# Patient Record
Sex: Female | Born: 1960 | Race: Black or African American | Hispanic: No | Marital: Married | Smoking: Never smoker
Health system: Southern US, Community
[De-identification: ages and names within clinical notes are randomized; demographics above are authoritative.]

## PROBLEM LIST (undated history)

## (undated) HISTORY — PX: NO PAST SURGERIES: SHX2092

---

## 2006-01-17 ENCOUNTER — Ambulatory Visit: Payer: Self-pay

## 2007-09-09 ENCOUNTER — Ambulatory Visit: Payer: Self-pay

## 2009-04-20 ENCOUNTER — Ambulatory Visit: Payer: Self-pay | Admitting: Obstetrics and Gynecology

## 2009-04-29 ENCOUNTER — Ambulatory Visit: Payer: Self-pay | Admitting: Obstetrics and Gynecology

## 2009-06-15 ENCOUNTER — Ambulatory Visit: Payer: Self-pay | Admitting: Obstetrics and Gynecology

## 2010-03-31 ENCOUNTER — Ambulatory Visit: Payer: Self-pay | Admitting: Gastroenterology

## 2010-04-04 LAB — PATHOLOGY REPORT

## 2010-08-26 ENCOUNTER — Ambulatory Visit: Payer: Self-pay | Admitting: Family Medicine

## 2010-09-12 ENCOUNTER — Ambulatory Visit: Payer: Self-pay | Admitting: Internal Medicine

## 2011-01-09 ENCOUNTER — Ambulatory Visit: Payer: Self-pay | Admitting: Family Medicine

## 2011-03-02 ENCOUNTER — Emergency Department: Payer: Self-pay | Admitting: Emergency Medicine

## 2011-03-02 ENCOUNTER — Ambulatory Visit: Payer: Self-pay | Admitting: Internal Medicine

## 2012-09-24 ENCOUNTER — Ambulatory Visit: Payer: Self-pay | Admitting: Family Medicine

## 2014-05-04 ENCOUNTER — Ambulatory Visit: Payer: Self-pay | Admitting: Family Medicine

## 2014-05-23 ENCOUNTER — Ambulatory Visit: Payer: Self-pay | Admitting: Emergency Medicine

## 2015-01-19 ENCOUNTER — Other Ambulatory Visit: Payer: Self-pay | Admitting: Family Medicine

## 2015-01-19 DIAGNOSIS — Z1231 Encounter for screening mammogram for malignant neoplasm of breast: Secondary | ICD-10-CM

## 2015-11-22 ENCOUNTER — Other Ambulatory Visit: Payer: Self-pay | Admitting: Family Medicine

## 2015-11-22 DIAGNOSIS — D649 Anemia, unspecified: Secondary | ICD-10-CM | POA: Insufficient documentation

## 2015-11-22 DIAGNOSIS — Z1231 Encounter for screening mammogram for malignant neoplasm of breast: Secondary | ICD-10-CM

## 2015-11-22 DIAGNOSIS — D259 Leiomyoma of uterus, unspecified: Secondary | ICD-10-CM | POA: Insufficient documentation

## 2015-11-22 DIAGNOSIS — D126 Benign neoplasm of colon, unspecified: Secondary | ICD-10-CM | POA: Insufficient documentation

## 2015-11-22 DIAGNOSIS — B977 Papillomavirus as the cause of diseases classified elsewhere: Secondary | ICD-10-CM | POA: Insufficient documentation

## 2015-12-19 ENCOUNTER — Ambulatory Visit
Admission: RE | Admit: 2015-12-19 | Discharge: 2015-12-19 | Disposition: A | Discharge status: Home / Self Care | Payer: BLUE CROSS/BLUE SHIELD | Source: Ambulatory Visit | Attending: Family Medicine | Admitting: Family Medicine

## 2015-12-19 ENCOUNTER — Other Ambulatory Visit: Payer: Self-pay | Admitting: Family Medicine

## 2015-12-19 DIAGNOSIS — Z1231 Encounter for screening mammogram for malignant neoplasm of breast: Secondary | ICD-10-CM

## 2016-05-25 ENCOUNTER — Other Ambulatory Visit: Payer: Self-pay | Admitting: Obstetrics and Gynecology

## 2016-05-25 DIAGNOSIS — Z1231 Encounter for screening mammogram for malignant neoplasm of breast: Secondary | ICD-10-CM

## 2016-12-11 DIAGNOSIS — E1169 Type 2 diabetes mellitus with other specified complication: Secondary | ICD-10-CM | POA: Insufficient documentation

## 2016-12-24 ENCOUNTER — Ambulatory Visit: Payer: BLUE CROSS/BLUE SHIELD | Attending: Obstetrics and Gynecology

## 2017-04-01 ENCOUNTER — Ambulatory Visit
Admission: RE | Admit: 2017-04-01 | Discharge: 2017-04-01 | Disposition: A | Discharge status: Home / Self Care | Payer: BLUE CROSS/BLUE SHIELD | Source: Ambulatory Visit | Attending: Obstetrics and Gynecology | Admitting: Obstetrics and Gynecology

## 2017-04-01 ENCOUNTER — Other Ambulatory Visit: Payer: Self-pay | Admitting: Obstetrics and Gynecology

## 2017-04-01 DIAGNOSIS — Z1231 Encounter for screening mammogram for malignant neoplasm of breast: Secondary | ICD-10-CM

## 2017-05-27 DIAGNOSIS — E538 Deficiency of other specified B group vitamins: Secondary | ICD-10-CM | POA: Insufficient documentation

## 2018-03-19 DIAGNOSIS — E7800 Pure hypercholesterolemia, unspecified: Secondary | ICD-10-CM | POA: Insufficient documentation

## 2018-03-19 DIAGNOSIS — Z79899 Other long term (current) drug therapy: Secondary | ICD-10-CM | POA: Insufficient documentation

## 2018-04-11 DIAGNOSIS — R200 Anesthesia of skin: Secondary | ICD-10-CM | POA: Insufficient documentation

## 2018-04-11 DIAGNOSIS — R202 Paresthesia of skin: Secondary | ICD-10-CM | POA: Insufficient documentation

## 2018-05-08 ENCOUNTER — Other Ambulatory Visit: Payer: Self-pay | Admitting: Internal Medicine

## 2018-05-08 DIAGNOSIS — Z1231 Encounter for screening mammogram for malignant neoplasm of breast: Secondary | ICD-10-CM

## 2018-05-21 ENCOUNTER — Encounter (INDEPENDENT_AMBULATORY_CARE_PROVIDER_SITE_OTHER): Payer: Self-pay

## 2018-05-21 ENCOUNTER — Ambulatory Visit
Admission: RE | Admit: 2018-05-21 | Discharge: 2018-05-21 | Disposition: A | Discharge status: Home / Self Care | Payer: BLUE CROSS/BLUE SHIELD | Source: Ambulatory Visit | Attending: Internal Medicine | Admitting: Internal Medicine

## 2018-05-21 DIAGNOSIS — Z1231 Encounter for screening mammogram for malignant neoplasm of breast: Secondary | ICD-10-CM

## 2018-12-04 ENCOUNTER — Other Ambulatory Visit: Payer: Self-pay

## 2018-12-04 ENCOUNTER — Ambulatory Visit
Admission: EM | Admit: 2018-12-04 | Discharge: 2018-12-04 | Disposition: A | Discharge status: Home / Self Care | Payer: BC Managed Care – PPO | Attending: Family Medicine | Admitting: Family Medicine

## 2018-12-04 DIAGNOSIS — H532 Diplopia: Secondary | ICD-10-CM | POA: Diagnosis not present

## 2018-12-04 NOTE — Discharge Instructions (Signed)
Call Dr. Ellin Mayhew tomorrow for an appt tomorrow.  If it occurs again, go to the ER.  Take care  Dr. Lacinda Axon

## 2018-12-04 NOTE — ED Provider Notes (Signed)
[facility]    CSN: [id number] Arrival date & time: 12/04/18  1821  History   Chief Complaint Chief Complaint  Patient presents with  . Diplopia   HPI  58 year old female presents with double vision.  Patient reports that she has had 2 episodes of double vision. First was 2 weeks ago.  Lasted approximately 60 minutes and resolved.  She states that she had an additional episode today.  Lasted approximately 1 minute and then resolved.  Patient states she did have her glasses on.  She does not have them with her currently.  She currently has no double vision.  No reports of speech difficulty, numbness, tingling, or weakness.  No reports of chest pain or shortness of breath.  No reports of vision loss.  No known inciting factor.  No known exacerbating relieving factors.  No other complaints.  PMH, Surgical Hx, Family Hx, Social History reviewed and updated as below.  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   Tubular adenoma of colon, unspecified 2011   Anemia    Allergic state    Arthritis    Type 2 diabetes mellitus with other specified complication, without long-term current use of insulin (CMS-HCC) 12/11/2016   Hypertension associated with type 2 diabetes mellitus (CMS-HCC)    Hypercholesterolemia 03/19/2018    Surgical Hx: CESAREAN SECTION      TUBAL LIGATION      COLONOSCOPY 03/31/2010  Adenomatous Polyp, FH Colon Polyps (Father): CBF 03/2015   COLONOSCOPY 09/20/2016  PH Adenomatous Polyp, FH Colon Polyps (Father): CBF 08/2021   COLPOSCOPY 11/11/2018      OB History   No obstetric history on file.    Home Medications    Prior to Admission medications   Medication Sig Start Date End Date Taking? Authorizing Provider  lisinopril-hydrochlorothiazide (ZESTORETIC) 10-12.5 MG tablet TAKE 1 TABLET BY MOUTH IN THE MORNING FOR BLOOD PRESSURE 10/13/18  Yes [provider]  loratadine (CLARITIN) 10 MG tablet  TAKE 1 TABLET BY MOUTH ONCE DAILY FOR ALLERGIES 06/18/18  Yes [provider]  metFORMIN (GLUCOPHAGE) 500 MG tablet TAKE ONE HALF TO ONE TABLET BY MOUTH ONCE DAILY AS DIRECTED. TAKE WITH FOOD FOR DIABETES. 06/17/18  Yes [provider]   Family History Family History  Problem Relation Age of Onset  . Diabetes Mother   . Hypertension Mother   . Dementia Father   . Breast cancer Neg Hx    Social History Social History   Tobacco Use  . Smoking status: Never Smoker  . Smokeless tobacco: Never Used  Substance Use Topics  . Alcohol use: Yes    Frequency: Never    Comment: rare  . Drug use: Never   Allergies   Tramadol   Review of Systems Review of Systems  Constitutional: Negative.   Eyes:       Double vision.  Neurological: Negative.    Physical Exam Triage Vital Signs ED Triage Vitals  Enc Vitals Group     BP 12/04/18 1853 (!) 149/78     Pulse Rate 12/04/18 1853 72     Resp 12/04/18 1853 16     Temp 12/04/18 1853 98.4 F (36.9 C)     Temp Source 12/04/18 1853 Oral     SpO2 12/04/18 1853 100 %     Weight 12/04/18 1850 162 lb (73.5 kg)     Height 12/04/18 1850 5' 3.5" (1.613 m)     Head Circumference --  Peak Flow --      Pain Score 12/04/18 1850 0     Pain Loc --      Pain Edu? --      Excl. in [place]? --    Updated Vital Signs BP (!) 149/78 (BP Location: Left Arm)   Pulse 72   Temp 98.4 F (36.9 C) (Oral)   Resp 16   Ht 5' 3.5" (1.613 m)   Wt 73.5 kg   SpO2 100%   BMI 28.25 kg/m   Visual Acuity Right Eye Distance: 20/50 Left Eye Distance: 20/30 Bilateral Distance: 20/25(uncorrected)  Right Eye Near:   Left Eye Near:    Bilateral Near:     Physical Exam Vitals signs and nursing note reviewed.  Constitutional:      General: She is not in acute distress.    Appearance: Normal appearance. She is not ill-appearing.  HENT:     Head: Normocephalic and atraumatic.  Eyes:     General: No scleral icterus.       Right eye: No  discharge.        Left eye: No discharge.     Extraocular Movements: Extraocular movements intact.     Conjunctiva/sclera: Conjunctivae normal.     Pupils: Pupils are equal, round, and reactive to light.  Cardiovascular:     Rate and Rhythm: Normal rate and regular rhythm.     Heart sounds: No murmur.  Pulmonary:     Effort: Pulmonary effort is normal.     Breath sounds: Normal breath sounds. No wheezing, rhonchi or rales.  Neurological:     General: No focal deficit present.     Mental Status: She is alert and oriented to person, place, and time.     Cranial Nerves: No cranial nerve deficit.     Sensory: No sensory deficit.     Motor: No weakness.  Psychiatric:        Mood and Affect: Mood normal.        Behavior: Behavior normal.    UC Treatments / Results  Labs (all labs ordered are listed, but only abnormal results are displayed) Labs Reviewed - No data to display  EKG   Radiology No results found.  Procedures Procedures (including critical care time)  Medications Ordered in UC Medications - No data to display  Initial Impression / Assessment and Plan / UC Course  I have reviewed the triage vital signs and the nursing notes.  Pertinent labs & imaging results that were available during my care of the patient were reviewed by me and considered in my medical decision making (see chart for details).    58 year old female presents with double vision.  She is currently asymptomatic.  Her exam is normal.  Uncertain etiology at this time.  Advised to contact her eye doctor and be seen tomorrow.  If symptoms recur, she should go to the ER for further evaluation and management.  Final Clinical Impressions(s) / UC Diagnoses   Final diagnoses:  Double vision     Discharge Instructions     Call Dr. Ellin Mayhew tomorrow for an appt tomorrow.  If it occurs again, go to the ER.  Take care  Dr. Lacinda Axon   ED Prescriptions    None     Controlled Substance Prescriptions  [place] Controlled Substance Registry consulted? Not Applicable   Coral Spikes, DO 12/04/18 2011

## 2018-12-04 NOTE — ED Triage Notes (Signed)
Patient states that she has had doubled vision episodes twice now. 1st one occurred 2 weeks ago and last about 30 seconds. 2nd episode occurred today and lasted around 2 mins. Patient states that she normally wears glasses but does not have them right now.

## 2019-09-15 ENCOUNTER — Other Ambulatory Visit: Payer: Self-pay | Admitting: Gerontology

## 2019-09-15 DIAGNOSIS — Z1231 Encounter for screening mammogram for malignant neoplasm of breast: Secondary | ICD-10-CM

## 2019-09-22 ENCOUNTER — Other Ambulatory Visit: Payer: Self-pay

## 2019-09-22 ENCOUNTER — Ambulatory Visit
Admission: RE | Admit: 2019-09-22 | Discharge: 2019-09-22 | Disposition: A | Discharge status: Home / Self Care | Payer: BC Managed Care – PPO | Source: Ambulatory Visit | Attending: Gerontology | Admitting: Gerontology

## 2019-09-22 DIAGNOSIS — Z1231 Encounter for screening mammogram for malignant neoplasm of breast: Secondary | ICD-10-CM

## 2019-09-24 DIAGNOSIS — I83893 Varicose veins of bilateral lower extremities with other complications: Secondary | ICD-10-CM | POA: Insufficient documentation

## 2019-09-24 DIAGNOSIS — G8929 Other chronic pain: Secondary | ICD-10-CM | POA: Insufficient documentation

## 2020-02-16 ENCOUNTER — Other Ambulatory Visit: Payer: Self-pay | Admitting: Obstetrics and Gynecology

## 2020-02-22 NOTE — H&P (Signed)
Brandy Hayden is a 59 y.o. female here for [facility].  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  Specimen B-Cervical Biopsy, cervix biopsy @ 12 o'clock:  BENIGN ENDOCERVICAL TISSUE.  Specimen C-Cervical Biopsy, cervix biopsy @ 5 o'clock:  HIGH-GRADE SQUAMOUS INTRAEPITHELIAL LESION (CIN 2).  TRANSFORMATION ZONE IDENTIFIED.  Specimen D-Cervical Biopsy, cervix biopsy @ 8 o'clock:  HIGH-GRADE SQUAMOUS INTRAEPITHELIAL LESION (CIN 2).    Past Medical History: has a past medical history of Allergic state, Anemia, Arthritis, Fibroid uterus, HPV test positive (10/23/11), Hypercholesterolemia (03/19/2018), Hypertension associated with type 2 diabetes mellitus (CMS-HCC), Panic attack, Tubular adenoma of colon, unspecified (2011), and Type 2 diabetes mellitus with other specified complication, without long-term current use of insulin (CMS-HCC) (12/11/2016).  Past Surgical History: has a past surgical history that includes Cesarean section; Tubal ligation; Colonoscopy (03/31/2010); Colonoscopy (09/20/2016); Colposcopy (11/11/2018); and ligation and division and complete stripping of long or short saphenous vein with excision deep fascia. Family History: family history includes Alzheimer's disease in her father; Atrial fibrillation (Abnormal heart rhythm sometimes requiring treatment with blood thinners) in her sister; Colon polyps in her father; Coronary Artery Disease (Blocked arteries around heart) in her mother; Diabetes type II in her brother and mother; Heart failure in her brother and sister; High blood pressure (Hypertension) in her father and mother; Myocardial Infarction (Heart attack) in her brother; Stroke in her mother. Social History: reports that she quit smoking about 36 years ago. She has never used smokeless tobacco.  She reports current alcohol use. She reports that she does not use drugs. OB/GYN History:  OB History  Gravida  2  Para  1  Term   Preterm   AB  1  Living  1   SAB  1  TAB   Ectopic   Molar   Multiple   Live Births  1      Allergies: is allergic to tramadol. Medications:  Current Outpatient Medications:  . cholecalciferol (VITAMIN D3) 1,000 unit capsule, Take 1,000 Units by mouth once daily, Disp: , Rfl:  . cyanocobalamin, vitamin B-12, (VITAMIN B-12) 5,000 mcg Subl, Place 1 tablet under the tongue once daily, Disp: , Rfl:  . lisinopriL-hydrochlorothiazide (ZESTORETIC) 10-12.5 mg tablet, Take 0.5 tablets by mouth every morning For blood pressure, Disp: 90 tablet, Rfl: 1 . loratadine (ALLERGY RELIEF, LORATADINE,) 10 mg tablet, Take 1 tablet (10 mg total) by mouth once daily For allergies, Disp: 90 tablet, Rfl: 3 . lovastatin (MEVACOR) 10 MG tablet, Take 1 tablet (10 mg total) by mouth every Sunday, Disp: 12 tablet, Rfl: 3 . metFORMIN (GLUCOPHAGE) 500 MG tablet, Take 1 tablet (500 mg total) by mouth daily with breakfast, Disp: 90 tablet, Rfl: 3 . potassium 99 mg Tab, Take 2 tablets by mouth once daily, Disp: , Rfl:   Review of Systems: General: No fatigue or weight loss Eyes: No vision changes Ears: No hearing difficulty Respiratory: No cough or shortness of breath Pulmonary: No asthma or shortness of breath Cardiovascular: No chest pain, palpitations, dyspnea on exertion Gastrointestinal: No abdominal bloating, chronic diarrhea, constipations, masses, pain or hematochezia Genitourinary: No hematuria, dysuria, abnormal vaginal discharge, pelvic pain, Menometrorrhagia Lymphatic: No swollen lymph nodes Musculoskeletal: No muscle weakness Neurologic: No extremity weakness, syncope, seizure disorder Psychiatric: No history of depression, delusions or suicidal/homicidal ideation  Exam:   Vitals:  12/14/19 1526  BP: 119/78  Pulse: 81   Body mass index is 27.72  kg/m.  WDWN black female in NAD  Lungs: CTA  CV : RRR without murmur   Neck: no thyromegaly Abdomen: soft , no mass, normal active bowel sounds, non-tender, no rebound tenderness Pelvic: tanner stage 5 ,  External genitalia: vulva /labia no lesions Urethra: no prolapse Vagina: normal physiologic d/c, adequate room for TVh if need be  Cervix: no lesions, no cervical motion tenderness  Uterus: normal size shape and contour, non-tender Adnexa: no mass, non-tender  Rectovaginal:   Impression:   Moderate cervical dysplasia    Plan:  After discussion and options for tx she has elected for a TVH and BSO Risks of the procedure discussed . SEE [place] notes .  Caroline Sauger, MD

## 2020-02-29 ENCOUNTER — Inpatient Hospital Stay: Admission: RE | Admit: 2020-02-29 | Payer: BC Managed Care – PPO | Source: Ambulatory Visit

## 2020-03-03 ENCOUNTER — Other Ambulatory Visit: Payer: BC Managed Care – PPO

## 2020-03-07 ENCOUNTER — Ambulatory Visit
Admission: RE | Admit: 2020-03-07 | Payer: BC Managed Care – PPO | Source: Home / Self Care | Admitting: Obstetrics and Gynecology

## 2020-03-07 ENCOUNTER — Encounter: Admission: RE | Payer: Self-pay | Source: Home / Self Care

## 2020-03-07 SURGERY — HYSTERECTOMY, VAGINAL
Anesthesia: General

## 2020-09-12 ENCOUNTER — Other Ambulatory Visit: Payer: Self-pay | Admitting: Gerontology

## 2020-09-12 DIAGNOSIS — Z1231 Encounter for screening mammogram for malignant neoplasm of breast: Secondary | ICD-10-CM

## 2020-09-22 ENCOUNTER — Other Ambulatory Visit: Payer: Self-pay

## 2020-09-22 ENCOUNTER — Ambulatory Visit
Admission: RE | Admit: 2020-09-22 | Discharge: 2020-09-22 | Disposition: A | Discharge status: Home / Self Care | Payer: BC Managed Care – PPO | Source: Ambulatory Visit | Attending: Gerontology | Admitting: Gerontology

## 2020-09-22 DIAGNOSIS — Z1231 Encounter for screening mammogram for malignant neoplasm of breast: Secondary | ICD-10-CM | POA: Insufficient documentation

## 2020-09-27 DIAGNOSIS — L608 Other nail disorders: Secondary | ICD-10-CM | POA: Insufficient documentation

## 2020-10-06 ENCOUNTER — Other Ambulatory Visit: Payer: Self-pay | Admitting: Gerontology

## 2020-10-06 DIAGNOSIS — N6489 Other specified disorders of breast: Secondary | ICD-10-CM

## 2020-10-06 DIAGNOSIS — R928 Other abnormal and inconclusive findings on diagnostic imaging of breast: Secondary | ICD-10-CM

## 2020-10-10 ENCOUNTER — Other Ambulatory Visit: Payer: Self-pay

## 2020-10-10 ENCOUNTER — Ambulatory Visit
Admission: RE | Admit: 2020-10-10 | Discharge: 2020-10-10 | Disposition: A | Discharge status: Home / Self Care | Payer: BC Managed Care – PPO | Source: Ambulatory Visit | Attending: Gerontology | Admitting: Gerontology

## 2020-10-10 DIAGNOSIS — R928 Other abnormal and inconclusive findings on diagnostic imaging of breast: Secondary | ICD-10-CM | POA: Insufficient documentation

## 2020-10-10 DIAGNOSIS — N6489 Other specified disorders of breast: Secondary | ICD-10-CM | POA: Insufficient documentation

## 2021-03-17 DIAGNOSIS — R2233 Localized swelling, mass and lump, upper limb, bilateral: Secondary | ICD-10-CM | POA: Insufficient documentation

## 2021-03-17 DIAGNOSIS — M21619 Bunion of unspecified foot: Secondary | ICD-10-CM | POA: Insufficient documentation

## 2021-06-01 DIAGNOSIS — J302 Other seasonal allergic rhinitis: Secondary | ICD-10-CM | POA: Insufficient documentation

## 2021-06-07 DIAGNOSIS — K5790 Diverticulosis of intestine, part unspecified, without perforation or abscess without bleeding: Secondary | ICD-10-CM | POA: Insufficient documentation

## 2021-07-30 ENCOUNTER — Other Ambulatory Visit: Payer: Self-pay

## 2021-07-30 ENCOUNTER — Ambulatory Visit
Admission: EM | Admit: 2021-07-30 | Discharge: 2021-07-30 | Disposition: A | Discharge status: Home / Self Care | Payer: BC Managed Care – PPO | Attending: Physician Assistant | Admitting: Physician Assistant

## 2021-07-30 DIAGNOSIS — R059 Cough, unspecified: Secondary | ICD-10-CM | POA: Diagnosis present

## 2021-07-30 DIAGNOSIS — R0981 Nasal congestion: Secondary | ICD-10-CM | POA: Insufficient documentation

## 2021-07-30 DIAGNOSIS — R051 Acute cough: Secondary | ICD-10-CM | POA: Insufficient documentation

## 2021-07-30 DIAGNOSIS — Z20822 Contact with and (suspected) exposure to covid-19: Secondary | ICD-10-CM | POA: Insufficient documentation

## 2021-07-30 DIAGNOSIS — J309 Allergic rhinitis, unspecified: Secondary | ICD-10-CM | POA: Insufficient documentation

## 2021-07-30 MED ORDER — CLARITIN-D 12 HOUR 5-120 MG PO TB12: 1.0000 | ORAL_TABLET | Freq: Two times a day (BID) | ORAL | 0 refills | Status: AC

## 2021-07-30 MED ORDER — IPRATROPIUM BROMIDE 0.06 % NA SOLN: 2.0000 | Freq: Four times a day (QID) | NASAL | 0 refills | Status: AC

## 2021-07-30 MED ORDER — BENZONATATE 100 MG PO CAPS: 200.0000 mg | ORAL_CAPSULE | Freq: Three times a day (TID) | ORAL | 0 refills | Status: AC

## 2021-07-30 NOTE — ED Provider Notes (Signed)
?[place] [facility] ? ? ? ?CSN: [id number] ?Arrival date & time: 07/30/21  6387 ? ? ?  ? ?History   ?Chief Complaint ?Chief Complaint  ?Patient presents with  ? Cough  ? Nasal Congestion  ?  Post nasal drip  ? ? ?HPI ?Brandy Hayden is a 61 y.o. female presenting for approximately 4-day history of nasal congestion, postnasal drainage, throat irritation and cough.  Denies fever, fatigue, aches, sinus pain, chest pain or breathing difficulty.  No nausea/vomiting or diarrhea reported.  No sick contacts.  Home COVID test is negative but she would like a second test.  Has been taking over-the-counter Claritin as well as OTC cough medication and using Flonase but says she does not think her symptoms are improving.  History of allergies.  No other complaints. ? ?HPI ? ?History reviewed. No pertinent past medical history. ? ?There are no problems to display for this patient. ? ? ?Past Surgical History:  ?Procedure Laterality Date  ? NO PAST SURGERIES    ? ? ?OB History   ?No obstetric history on file. ?  ? ? ? ?Home Medications   ? ?Prior to Admission medications   ?Medication Sig Start Date End Date Taking? Authorizing Provider  ?aspirin EC 81 MG tablet Take 81 mg by mouth daily. Swallow whole.   Yes [provider]  ?benzonatate (TESSALON) 100 MG capsule Take 2 capsules (200 mg total) by mouth every 8 (eight) hours. 07/30/21  Yes Danton Clap, PA-C  ?Cholecalciferol (VITAMIN D3 PO) Take 1 capsule by mouth daily.   Yes [provider]  ?Cyanocobalamin (B-12 PO) Take 1 capsule by mouth daily.   Yes [provider]  ?ipratropium (ATROVENT) 0.06 % nasal spray Place 2 sprays into both nostrils 4 (four) times daily. 07/30/21  Yes Laurene Footman B, PA-C  ?lisinopril-hydrochlorothiazide (ZESTORETIC) 10-12.5 MG tablet Take 1 tablet by mouth daily.  10/13/18  Yes [provider]  ?loratadine-pseudoephedrine (CLARITIN-D 12 HOUR) 5-120 MG tablet Take 1 tablet by mouth 2 (two) times  daily. 07/30/21  Yes Laurene Footman B, PA-C  ?lovastatin (MEVACOR) 10 MG tablet Take 10 mg by mouth every Sunday. 01/21/20  Yes [provider]  ?metFORMIN (GLUCOPHAGE) 500 MG tablet Take 500 mg by mouth daily.  06/17/18  Yes [provider]  ?POTASSIUM PO Take 1 tablet by mouth daily.   Yes [provider]  ? ? ?Family History ?Family History  ?Problem Relation Age of Onset  ? Diabetes Mother   ? Hypertension Mother   ? Dementia Father   ? Breast cancer Neg Hx   ? ? ?Social History ?Social History  ? ?Tobacco Use  ? Smoking status: Never  ? Smokeless tobacco: Never  ?Vaping Use  ? Vaping Use: Never used  ?Substance Use Topics  ? Alcohol use: Yes  ?  Comment: rare  ? Drug use: Never  ? ? ? ?Allergies   ?Tramadol ? ? ?Review of Systems ?Review of Systems  ?Constitutional:  Negative for chills, diaphoresis, fatigue and fever.  ?HENT:  Positive for congestion, postnasal drip and rhinorrhea. Negative for ear pain, sinus pressure, sinus pain and sore throat.   ?Respiratory:  Positive for cough. Negative for shortness of breath.   ?Gastrointestinal:  Negative for abdominal pain, nausea and vomiting.  ?Musculoskeletal:  Negative for arthralgias and myalgias.  ?Skin:  Negative for rash.  ?Neurological:  Negative for weakness and headaches.  ?Hematological:  Negative for adenopathy.  ? ? ?Physical Exam ?Triage Vital Signs ?  ED Triage Vitals  ?Enc Vitals Group  ?   BP 07/30/21 0940 (!) 157/76  ?   Pulse Rate 07/30/21 0940 94  ?   Resp 07/30/21 0940 14  ?   Temp 07/30/21 0940 98.5 ?F (36.9 ?C)  ?   Temp Source 07/30/21 0940 Oral  ?   SpO2 07/30/21 0940 98 %  ?   Weight 07/30/21 0941 158 lb (71.7 kg)  ?   Height 07/30/21 0941 '5\' 3"'$  (1.6 m)  ?   Head Circumference --   ?   Peak Flow --   ?   Pain Score 07/30/21 0941 0  ?   Pain Loc --   ?   Pain Edu? --   ?   Excl. in [place]? --   ? ?No data found. ? ?Updated Vital Signs ?BP (!) 157/76 (BP Location: Left Arm) Comment: has not taken her BP meds today  Pulse 94    Temp 98.5 ?F (36.9 ?C) (Oral)   Resp 14   Ht '5\' 3"'$  (1.6 m)   Wt 158 lb (71.7 kg)   SpO2 98%   BMI 27.99 kg/m?  ?   ? ?Physical Exam ?Vitals and nursing note reviewed.  ?Constitutional:   ?   General: She is not in acute distress. ?   Appearance: Normal appearance. She is not ill-appearing or toxic-appearing.  ?HENT:  ?   Head: Normocephalic and atraumatic.  ?   Nose: Congestion present.  ?   Mouth/Throat:  ?   Mouth: Mucous membranes are moist.  ?   Pharynx: Oropharynx is clear. Posterior oropharyngeal erythema present.  ?Eyes:  ?   General: No scleral icterus.    ?   Right eye: No discharge.     ?   Left eye: No discharge.  ?   Conjunctiva/sclera: Conjunctivae normal.  ?Cardiovascular:  ?   Rate and Rhythm: Normal rate and regular rhythm.  ?   Heart sounds: Normal heart sounds.  ?Pulmonary:  ?   Effort: Pulmonary effort is normal. No respiratory distress.  ?   Breath sounds: Normal breath sounds.  ?Musculoskeletal:  ?   Cervical back: Neck supple.  ?Skin: ?   General: Skin is dry.  ?Neurological:  ?   General: No focal deficit present.  ?   Mental Status: She is alert. Mental status is at baseline.  ?   Motor: No weakness.  ?   Gait: Gait normal.  ?Psychiatric:     ?   Mood and Affect: Mood normal.     ?   Behavior: Behavior normal.     ?   Thought Content: Thought content normal.  ? ? ? ?UC Treatments / Results  ?Labs ?(all labs ordered are listed, but only abnormal results are displayed) ?Labs Reviewed  ?SARS CORONAVIRUS 2 (TAT 6-24 HRS)  ? ? ?EKG ? ? ?Radiology ?No results found. ? ?Procedures ?Procedures (including critical care time) ? ?Medications Ordered in UC ?Medications - No data to display ? ?Initial Impression / Assessment and Plan / UC Course  ?I have reviewed the triage vital signs and the nursing notes. ? ?Pertinent labs & imaging results that were available during my care of the patient were reviewed by me and considered in my medical decision making (see chart for details). ? ?61 year old  female presenting for 4-day history of nasal congestion, postnasal drainage and cough.  History of allergies.  States symptoms are worse than her normal allergies are but she has had worsening  allergy symptoms this year than normal.  Takes daily Claritin and uses Flonase.  Is also been taking over-the-counter cough medicine.  She is afebrile and overall well-appearing.  Significant nasal congestion and mucosal edema, erythema posterior pharynx and clear postnasal drainage.  Chest clear to auscultation. ? ?PCR COVID test obtained.  Advised on x-ray results.  If positive will contact patient.  Reviewed current CDC count, isolation protocol and ED precautions if positive. ? ?Advised patient symptoms consistent with allergies versus a virus.  Sent Claritin-D to pharmacy as well as advised her to stop taking regular Claritin while taking this.  Also sent benzonatate and Atrovent nasal spray.  Reviewed return and ED precautions. ? ?Final Clinical Impressions(s) / UC Diagnoses  ? ?Final diagnoses:  ?Allergic rhinitis, unspecified seasonality, unspecified trigger  ?Nasal congestion  ?Acute cough  ? ? ? ?Discharge Instructions   ? ?  ?-Allergies versus a virus.  We have tested you for COVID-19.  You will be back in 24 hours.  If you are positive you need to isolate 5 days and wear mask for 5 days. ?We will contact you with any results are positive, otherwise will be on your MyChart. ?- I have sent cough medication to the pharmacy as well as prescription for Claritin-D.  Stop taking the regular Claritin while you take this.  Also sent Atrovent nasal spray.  Can use nasal saline to. ?- Return if fever or worsening symptoms. ? ? ? ? ?ED Prescriptions   ? ? Medication Sig Dispense Auth. Provider  ? loratadine-pseudoephedrine (CLARITIN-D 12 HOUR) 5-120 MG tablet Take 1 tablet by mouth 2 (two) times daily. 14 tablet Laurene Footman B, PA-C  ? benzonatate (TESSALON) 100 MG capsule Take 2 capsules (200 mg total) by mouth every 8  (eight) hours. 21 capsule Laurene Footman B, PA-C  ? ipratropium (ATROVENT) 0.06 % nasal spray Place 2 sprays into both nostrils 4 (four) times daily. 15 mL Laurene Footman B, PA-C  ? ?  ? ?PDMP not reviewed this enc

## 2021-07-30 NOTE — ED Triage Notes (Signed)
Patient C/O of post nasal drip that started around last Wednesday and has been causing her to cough. She denies fever, and states she tested negative for fever. ?

## 2021-07-30 NOTE — Discharge Instructions (Addendum)
-  Allergies versus a virus.  We have tested you for COVID-19.  You will be back in 24 hours.  If you are positive you need to isolate 5 days and wear mask for 5 days. ?We will contact you with any results are positive, otherwise will be on your MyChart. ?- I have sent cough medication to the pharmacy as well as prescription for Claritin-D.  Stop taking the regular Claritin while you take this.  Also sent Atrovent nasal spray.  Can use nasal saline to. ?- Return if fever or worsening symptoms. ?

## 2021-07-31 LAB — SARS CORONAVIRUS 2 (TAT 6-24 HRS): SARS Coronavirus 2: NEGATIVE

## 2021-08-01 ENCOUNTER — Ambulatory Visit
Admission: EM | Admit: 2021-08-01 | Discharge: 2021-08-01 | Disposition: A | Discharge status: Home / Self Care | Payer: BC Managed Care – PPO | Attending: Physician Assistant | Admitting: Physician Assistant

## 2021-08-01 DIAGNOSIS — T7840XD Allergy, unspecified, subsequent encounter: Secondary | ICD-10-CM

## 2021-08-01 DIAGNOSIS — N950 Postmenopausal bleeding: Secondary | ICD-10-CM | POA: Insufficient documentation

## 2021-08-01 DIAGNOSIS — E1159 Type 2 diabetes mellitus with other circulatory complications: Secondary | ICD-10-CM | POA: Insufficient documentation

## 2021-08-01 MED ORDER — PREDNISONE 50 MG PO TABS: ORAL_TABLET | ORAL | 0 refills | Status: DC

## 2021-08-01 NOTE — ED Triage Notes (Signed)
Patient presents to Urgent Care for follow-up appt. She states crackling sensation in right ear and stuffiness to left ear. Treating her symptoms with medications prescribed at last visit.  ?

## 2021-08-01 NOTE — ED Provider Notes (Signed)
?[facility] ? ? ? ?CSN: [id number] ?Arrival date & time: 08/01/21  1812 ? ? ?  ? ?History   ?Chief Complaint ?Chief Complaint  ?Patient presents with  ? Ear Fullness  ? Follow-up  ? ? ?HPI ?Brandy Hayden is a 61 y.o. female.  ? ?Pt complains of head congestion and bilat ear pain.  Pt reports drainge down throat.  No relief with decongestants of nasal spray.   ? ?The history is provided by the patient. No language interpreter was used.  ?Ear Fullness ?This is a new problem. Episode onset: 4 days. The problem occurs constantly. The problem has been gradually worsening. Nothing aggravates the symptoms. Nothing relieves the symptoms.  ? ?History reviewed. No pertinent past medical history. ? ?Patient Active Problem List  ? Diagnosis Date Noted  ? Hypertension associated with type 2 diabetes mellitus ([place]) 08/01/2021  ? PMB (postmenopausal bleeding) 08/01/2021  ? Diverticulosis 06/07/2021  ? Seasonal allergies 06/01/2021  ? Bunion 03/17/2021  ? Nodule of finger, bilateral 03/17/2021  ? Splinter hemorrhage of fingernail 09/27/2020  ? Chronic hip pain, left 09/24/2019  ? Varicose veins of both legs with edema 09/24/2019  ? Numbness and tingling in both hands 04/11/2018  ? High risk medication use 03/19/2018  ? Hypercholesterolemia 03/19/2018  ? B12 deficiency 05/27/2017  ? Type 2 diabetes mellitus with other specified complication ([place]) 89/21/1942  ? Fibroid uterus 11/22/2015  ? 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 Mild chronic anemia 11/22/2015  ? Tubular adenoma of colon 11/22/2015  ? ? ?Past Surgical History:  ?Procedure Laterality Date  ? NO PAST SURGERIES    ? ? ?OB History   ?No obstetric history on file. ?  ? ? ? ?Home Medications   ? ?Prior to Admission medications   ?Medication Sig Start Date End Date Taking? Authorizing Provider  ?predniSONE (DELTASONE) 50 MG tablet One tablet a day 08/01/21  Yes Fransico Meadow, PA-C  ?aspirin EC 81 MG tablet Take 81 mg by mouth daily. Swallow whole.    [provider]  ?benzonatate (TESSALON) 100 MG capsule Take 2 capsules (200 mg total) by mouth every 8 (eight) hours. 07/30/21   Danton Clap, PA-C  ?Cholecalciferol (VITAMIN D3 PO) Take 1 capsule by mouth daily.    [provider]  ?Cyanocobalamin (B-12 PO) Take 1 capsule by mouth daily.    [provider]  ?ipratropium (ATROVENT) 0.06 % nasal spray Place 2 sprays into both nostrils 4 (four) times daily. 07/30/21   Danton Clap, PA-C  ?lisinopril-hydrochlorothiazide (ZESTORETIC) 10-12.5 MG tablet Take 1 tablet by mouth daily.  10/13/18   [provider]  ?loratadine-pseudoephedrine (CLARITIN-D 12 HOUR) 5-120 MG tablet Take 1 tablet by mouth 2 (two) times daily. 07/30/21   Danton Clap, PA-C  ?lovastatin (MEVACOR) 10 MG tablet Take 10 mg by mouth every Sunday. 01/21/20   [provider]  ?metFORMIN (GLUCOPHAGE) 500 MG tablet Take 500 mg by mouth daily.  06/17/18   [provider]  ?POTASSIUM PO Take 1 tablet by mouth daily.    [provider]  ? ? ?Family History ?Family History  ?Problem Relation Age of Onset  ? Diabetes Mother   ? Hypertension Mother   ? Dementia Father   ? Breast cancer Neg Hx   ? ? ?Social History ?Social History  ? ?Tobacco Use  ? Smoking status: Never  ? Smokeless tobacco: Never  ?Vaping Use  ? Vaping Use: Never used  ?Substance Use Topics  ?  Alcohol use: Yes  ?  Comment: rare  ? Drug use: Never  ? ? ? ?Allergies   ?Tramadol ? ? ?Review of Systems ?Review of Systems  ?HENT:  Positive for congestion.   ?All other systems reviewed and are negative. ? ? ?Physical Exam ?Triage Vital Signs ?ED Triage Vitals  ?Enc Vitals Group  ?   BP 08/01/21 1857 (!) 133/91  ?   Pulse Rate 08/01/21 1857 95  ?   Resp 08/01/21 1857 16  ?   Temp 08/01/21 1857 98.6 ?F (37 ?C)  ?   Temp Source 08/01/21 1857 Oral  ?   SpO2 08/01/21 1857 98 %  ?   Weight --   ?   Height --   ?   Head Circumference --   ?   Peak Flow --   ?   Pain Score 08/01/21 1859 0  ?   Pain  Loc --   ?   Pain Edu? --   ?   Excl. in [place]? --   ? ?No data found. ? ?Updated Vital Signs ?BP (!) 133/91 (BP Location: Left Arm)   Pulse 95   Temp 98.6 ?F (37 ?C) (Oral)   Resp 16   SpO2 98%  ? ?Visual Acuity ?Right Eye Distance:   ?Left Eye Distance:   ?Bilateral Distance:   ? ?Right Eye Near:   ?Left Eye Near:    ?Bilateral Near:    ? ?Physical Exam ?Vitals reviewed.  ?Constitutional:   ?   Appearance: Normal appearance.  ?HENT:  ?   Head: Normocephalic.  ?   Right Ear: Tympanic membrane normal.  ?   Left Ear: Tympanic membrane normal.  ?   Nose: Congestion and rhinorrhea present.  ?   Mouth/Throat:  ?   Mouth: Mucous membranes are moist.  ?Cardiovascular:  ?   Rate and Rhythm: Normal rate.  ?Pulmonary:  ?   Effort: Pulmonary effort is normal.  ?Musculoskeletal:  ?   Cervical back: Normal range of motion.  ?Skin: ?   General: Skin is warm.  ?Neurological:  ?   General: No focal deficit present.  ?   Mental Status: She is alert.  ?Psychiatric:     ?   Mood and Affect: Mood normal.  ? ? ? ?UC Treatments / Results  ?Labs ?(all labs ordered are listed, but only abnormal results are displayed) ?Labs Reviewed - No data to display ? ?EKG ? ? ?Radiology ?No results found. ? ?Procedures ?Procedures (including critical care time) ? ?Medications Ordered in UC ?Medications - No data to display ? ?Initial Impression / Assessment and Plan / UC Course  ?I have reviewed the triage vital signs and the nursing notes. ? ?Pertinent labs & imaging results that were available during my care of the patient were reviewed by me and considered in my medical decision making (see chart for details). ? ?  ? ?MDM:  I suspect symptoms re allergic, no sign of infection.  Pt advised to follow up with primary care for recheck.  Prednsione 50 mg x 5 days  ? ?Final diagnoses:  ?Allergy, subsequent encounter  ? ? ? ?Discharge Instructions   ? ?  ?Continue decongestants.  Prednisone for 5 days.  Return if any problems.   ? ? ?ED Prescriptions    ? ? Medication Sig Dispense Auth. Provider  ? predniSONE (DELTASONE) 50 MG tablet One tablet a day 5 tablet Fransico Meadow, [place]  ? ?  ? ?PDMP not  reviewed this encounter. ?  ?Fransico Meadow, PA-C ?08/01/21 2027 ? ?

## 2021-08-01 NOTE — Discharge Instructions (Signed)
Continue decongestants.  Prednisone for 5 days.  Return if any problems.   ?

## 2021-09-15 ENCOUNTER — Other Ambulatory Visit: Payer: Self-pay | Admitting: Gerontology

## 2021-09-15 DIAGNOSIS — Z1231 Encounter for screening mammogram for malignant neoplasm of breast: Secondary | ICD-10-CM

## 2021-10-26 ENCOUNTER — Ambulatory Visit
Admission: RE | Admit: 2021-10-26 | Discharge: 2021-10-26 | Disposition: A | Discharge status: Home / Self Care | Payer: BC Managed Care – PPO | Source: Ambulatory Visit | Attending: Gerontology | Admitting: Gerontology

## 2021-10-26 DIAGNOSIS — Z1231 Encounter for screening mammogram for malignant neoplasm of breast: Secondary | ICD-10-CM | POA: Insufficient documentation

## 2022-02-18 ENCOUNTER — Ambulatory Visit
Admission: EM | Admit: 2022-02-18 | Discharge: 2022-02-18 | Disposition: A | Discharge status: Home / Self Care | Payer: BC Managed Care – PPO | Attending: Family Medicine | Admitting: Family Medicine

## 2022-02-18 DIAGNOSIS — U071 COVID-19: Secondary | ICD-10-CM | POA: Insufficient documentation

## 2022-02-18 LAB — RESP PANEL BY RT-PCR (FLU A&B, COVID) ARPGX2
Influenza A by PCR: NEGATIVE
Influenza B by PCR: NEGATIVE
SARS Coronavirus 2 by RT PCR: POSITIVE — AB

## 2022-02-18 MED ORDER — MOLNUPIRAVIR EUA 200MG CAPSULE: 4.0000 | ORAL_CAPSULE | Freq: Two times a day (BID) | ORAL | 0 refills | Status: AC

## 2022-02-18 MED ORDER — MOLNUPIRAVIR EUA 200MG CAPSULE: 4.0000 | ORAL_CAPSULE | Freq: Two times a day (BID) | ORAL | 0 refills | Status: DC

## 2022-02-18 NOTE — Discharge Instructions (Addendum)
Your test for COVID-19 was positive, meaning that you were infected with the novel coronavirus and could give the germ to others.  Please continue isolation at home for at least 5 days since the start of your symptoms. Once you complete your 5 day quarantine, you may return to normal activities as long as you've not had a fever for over 24 hours(without taking fever reducing medicine) and your symptoms are improving. Be sure to wear a mask until Day 11.   Please continue good preventive care measures, including:  frequent hand-washing, avoid touching your face, cover coughs/sneezes, stay out of crowds and keep a 6 foot distance from others.  Go to the nearest hospital emergency room if fever/cough/breathlessness are severe or illness seems like a threat to life.

## 2022-02-18 NOTE — ED Triage Notes (Signed)
Patient reports that she took an at home covid test this AM and it was positive.   Patient reports that she felt a tickle in her throat yesterday and she thought it was allergies. This AM she felt tired so she took one.

## 2022-02-18 NOTE — ED Provider Notes (Signed)
[facility]    CSN: [id number] Arrival date & time: 02/18/22  1341      History   Chief Complaint Chief Complaint  Patient presents with   Fever   Fatigue   Cough   Sore Throat   Covid Positive    HPI Brandy Hayden is a 61 y.o. female.   HPI   Brandy Hayden presents for fever with sore throat and cough since yesterday. States she had a dripping sensation in the back of her throat. She was getting ready for church this morning and decided to take a COVID test as she felt tired. She got off work yesterday sneezing and coughing. Her COVID test was positive.  She thought she had allergies. She took Tylenol around 11 AM. She has some lower back pain.  No vomiting or diarrhea.    History reviewed. No pertinent past medical history.  Patient Active Problem List   Diagnosis Date Noted   Hypertension associated with type 2 diabetes mellitus ([place]) 08/01/2021   PMB (postmenopausal bleeding) 08/01/2021   Diverticulosis 06/07/2021   Seasonal allergies 06/01/2021   Bunion 03/17/2021   Nodule of finger, bilateral 03/17/2021   Splinter hemorrhage of fingernail 09/27/2020   Chronic hip pain, left 09/24/2019   Varicose veins of both legs with edema 09/24/2019   Numbness and tingling in both hands 04/11/2018   High risk medication use 03/19/2018   Hypercholesterolemia 03/19/2018   B12 deficiency 05/27/2017   Type 2 diabetes mellitus with other specified complication ([place]) 78/67/6720   Fibroid uterus 11/22/2015   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      Home Medications    Prior to Admission medications   Medication Sig Start Date End Date Taking? Authorizing Provider  aspirin EC 81 MG tablet Take 81 mg by mouth daily. Swallow whole.    [provider]   benzonatate (TESSALON) 100 MG capsule Take 2 capsules (200 mg total) by mouth every 8 (eight) hours. 07/30/21   Danton Clap, PA-C  Cholecalciferol (VITAMIN D3 PO) Take 1 capsule by mouth daily.    [provider]  Cyanocobalamin (B-12 PO) Take 1 capsule by mouth daily.    [provider]  ipratropium (ATROVENT) 0.06 % nasal spray Place 2 sprays into both nostrils 4 (four) times daily. 07/30/21   Danton Clap, PA-C  lisinopril-hydrochlorothiazide (ZESTORETIC) 10-12.5 MG tablet Take 1 tablet by mouth daily.  10/13/18   [provider]  loratadine-pseudoephedrine (CLARITIN-D 12 HOUR) 5-120 MG tablet Take 1 tablet by mouth 2 (two) times daily. 07/30/21   Danton Clap, PA-C  lovastatin (MEVACOR) 10 MG tablet Take 10 mg by mouth every Sunday. 01/21/20   [provider]  metFORMIN (GLUCOPHAGE) 500 MG tablet Take 500 mg by mouth daily.  06/17/18   [provider]  molnupiravir EUA (LAGEVRIO) 200 mg CAPS capsule Take 4 capsules (800 mg total) by mouth 2 (two) times daily for 5 days. 02/18/22 02/23/22  Lyndee Hensen, DO  POTASSIUM PO Take 1 tablet by mouth daily.    [provider]    Family History Family History  Problem Relation Age of Onset   Diabetes Mother    Hypertension Mother    Dementia Father    Breast cancer Neg  Hx     Social History Social History   Tobacco Use   Smoking status: Never   Smokeless tobacco: Never  Vaping Use   Vaping Use: Never used  Substance Use Topics   Alcohol use: Yes    Comment: rare   Drug use: Never     Allergies   Tramadol   Review of Systems Review of Systems: negative unless otherwise stated in HPI.      Physical Exam Triage Vital Signs ED Triage Vitals  Enc Vitals Group     BP 02/18/22 1411 (!) 146/87     Pulse Rate 02/18/22 1411 97     Resp --      Temp 02/18/22 1411 100.2 F (37.9 C)     Temp Source 02/18/22 1411 Oral     SpO2 02/18/22 1411 96 %     Weight 02/18/22  1409 160 lb (72.6 kg)     Height 02/18/22 1409 '5\' 3"'$  (1.6 m)     Head Circumference --      Peak Flow --      Pain Score 02/18/22 1409 0     Pain Loc --      Pain Edu? --      Excl. in [place]? --    No data found.  Updated Vital Signs BP (!) 146/87 (BP Location: Left Arm)   Pulse 97   Temp 100.2 F (37.9 C) (Oral)   Ht '5\' 3"'$  (1.6 m)   Wt 72.6 kg   SpO2 96%   BMI 28.34 kg/m   Visual Acuity Right Eye Distance:   Left Eye Distance:   Bilateral Distance:    Right Eye Near:   Left Eye Near:    Bilateral Near:     Physical Exam GEN:     alert, non-toxic appearing female in no distress     HENT:  mucus membranes moist, oropharyngeal without lesions or exudate, no tonsillar hypertrophy, no oropharyngeal erythema, moderate erythematous edematous turbinates, clear nasal discharge, bilateral TM normal EYES:   pupils equal and reactive, EOMi, no scleral injection or discharge NECK:  normal ROM, no lymphadenopathy, no meningismus   RESP:  no increased work of breathing, clear to auscultation bilaterally CVS:   regular rate and rhythm Skin:   warm and dry, no rash on visible skin    UC Treatments / Results  Labs (all labs ordered are listed, but only abnormal results are displayed) Labs Reviewed  RESP PANEL BY RT-PCR (FLU A&B, COVID) ARPGX2 - Abnormal; Notable for the following components:      Result Value   SARS Coronavirus 2 by RT PCR POSITIVE (*)    All other components within normal limits    EKG   Radiology No results found.  Procedures Procedures (including critical care time)  Medications Ordered in UC Medications - No data to display  Initial Impression / Assessment and Plan / UC Course  I have reviewed the triage vital signs and the nursing notes.  Pertinent labs & imaging results that were available during my care of the patient were reviewed by me and considered in my medical decision making (see chart for details).       Pt is a 61 y.o. female who  presents for 2 days of respiratory symptoms. [name] has an elevated temperature here of 100.2 F. Satting well on room air. Overall she is well-appearing, well-hydrated and in no respiratory distress.  Discussed symptomatic treatment.  Pulmonary exam she has equal aeration bilaterally, imaging  deferred.  Influenza negative.  COVID-positive.  She has never had COVID before.  All questions asked were answered.  She is  interested in treatment for COVID. After shared decision making, she is interested in [place].  Rx sent to pharmacy.  Work note provided.[place] instructions provided.  ED and return precautions and understanding voiced. Discussed MDM, treatment plan and plan for follow-up with patient/parent who agrees with plan.      Final Clinical Impressions(s) / UC Diagnoses   Final diagnoses:  [id number]     Discharge Instructions      Your test for COVID-19 was positive, meaning that you were infected with the novel coronavirus and could give the germ to others.  Please continue isolation at home for at least 5 days since the start of your symptoms. Once you complete your 5 day quarantine, you may return to normal activities as long as you've not had a fever for over 24 hours(without taking fever reducing medicine) and your symptoms are improving. Be sure to wear a mask until Day 11.   Please continue good preventive care measures, including:  frequent hand-washing, avoid touching your face, cover coughs/sneezes, stay out of crowds and keep a 6 foot distance from others.  Go to the nearest hospital emergency room if fever/cough/breathlessness are severe or illness seems like a threat to life.      ED Prescriptions     Medication Sig Dispense Auth. Provider   molnupiravir EUA (LAGEVRIO) 200 mg CAPS capsule Take 4 capsules (800 mg total) by mouth 2 (two) times daily for 5 days. 40 capsule Yesenia Locurto, DO      PDMP not reviewed this encounter.   Lyndee Hensen,  DO 02/21/22 0136

## 2022-02-19 ENCOUNTER — Ambulatory Visit
Admission: EM | Admit: 2022-02-19 | Discharge: 2022-02-19 | Disposition: A | Discharge status: Home / Self Care | Payer: BC Managed Care – PPO | Attending: Physician Assistant | Admitting: Physician Assistant

## 2022-02-19 DIAGNOSIS — R002 Palpitations: Secondary | ICD-10-CM

## 2022-02-19 DIAGNOSIS — R0789 Other chest pain: Secondary | ICD-10-CM

## 2022-02-19 DIAGNOSIS — U071 COVID-19: Secondary | ICD-10-CM

## 2022-02-19 NOTE — Discharge Instructions (Signed)
-  You should follow-up in the emergency department.  As discussed, COVID can cause damage to your heart and you are showing evidence of abnormal heart rhythm.  You have been advised to follow up immediately in the emergency department for concerning signs.symptoms. If you declined EMS transport, please have a family member take you directly to the ED at this time. Do not delay. Based on concerns about condition, if you do not follow up in th e ED, you may risk poor outcomes including worsening of condition, delayed treatment and potentially life threatening issues. If you have declined to go to the ED at this time, you should call your PCP immediately to set up a follow up appointment.  Go to ED for red flag symptoms, including; fevers you cannot reduce with Tylenol/Motrin, severe headaches, vision changes, numbness/weakness in part of the body, lethargy, confusion, intractable vomiting, severe dehydration, chest pain, breathing difficulty, severe persistent abdominal or pelvic pain, signs of severe infection (increased redness, swelling of an area), feeling faint or passing out, dizziness, etc. You should especially go to the ED for sudden acute worsening of condition if you do not elect to go at this time.

## 2022-02-19 NOTE — ED Triage Notes (Signed)
Pt states she tested positive for covid on 02/18/22 and had a temperature of 101.5 today.  Pt states that she is having tightness and burning along her upper chest and lower back pain.   Pt states the pain has been there for a couple of hours. Pt describes it has a tightness and burning sensation. Pt states that the tightness is still present.   Pt denies any SOB, dizziness, lightheadedness, nausea,

## 2022-02-19 NOTE — ED Provider Notes (Signed)
[facility]    CSN: [id number] Arrival date & time: 02/19/22  1636      History   Chief Complaint Chief Complaint  Patient presents with   Chest Pain    HPI Brandy Hayden is a 61 y.o. female who has been having COVID symptoms for a few days.  Tested positive for COVID-19 at this clinic yesterday.  Reports today she started to experience chest tightness several hours ago.  She reports that pain in tightness of the level 3 out of 10.  She says it has not improved despite taking Tylenol.  She reports she had a fever of 101.5 degrees when the tightness started.  Since the fever has broken and she continues to feel the tightness in her chest.  Tightness does not radiate.  No numbness, weakness or tingling.  She reports occasional palpitations.  No shortness of breath.  No dizziness or lightheadedness.  No vomiting, sweats.  Medical history significant for hypertension and type 2 diabetes.  Reports generalized anxiety.  Denies any history of heart problems.  HPI  History reviewed. No pertinent past medical history.  Patient Active Problem List   Diagnosis Date Noted   Hypertension associated with type 2 diabetes mellitus ([place]) 08/01/2021   PMB (postmenopausal bleeding) 08/01/2021   Diverticulosis 06/07/2021   Seasonal allergies 06/01/2021   Bunion 03/17/2021   Nodule of finger, bilateral 03/17/2021   Splinter hemorrhage of fingernail 09/27/2020   Chronic hip pain, left 09/24/2019   Varicose veins of both legs with edema 09/24/2019   Numbness and tingling in both hands 04/11/2018   High risk medication use 03/19/2018   Hypercholesterolemia 03/19/2018   B12 deficiency 05/27/2017   Type 2 diabetes mellitus with other specified complication ([place]) 09/98/3382   Fibroid uterus 11/22/2015   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      Home Medications    Prior to Admission medications   Medication Sig Start Date End Date Taking? Authorizing Provider  aspirin EC 81 MG tablet Take 81 mg by mouth daily. Swallow whole.   Yes [provider]  benzonatate (TESSALON) 100 MG capsule Take 2 capsules (200 mg total) by mouth every 8 (eight) hours. 07/30/21  Yes Danton Clap, PA-C  Cholecalciferol (VITAMIN D3 PO) Take 1 capsule by mouth daily.   Yes [provider]  Cyanocobalamin (B-12 PO) Take 1 capsule by mouth daily.   Yes [provider]  ipratropium (ATROVENT) 0.06 % nasal spray Place 2 sprays into both nostrils 4 (four) times daily. 07/30/21  Yes Danton Clap, PA-C  lisinopril-hydrochlorothiazide (ZESTORETIC) 10-12.5 MG tablet Take 1 tablet by mouth daily.  10/13/18  Yes [provider]  loratadine-pseudoephedrine (CLARITIN-D 12 HOUR) 5-120 MG tablet Take 1 tablet by mouth 2 (two) times daily. 07/30/21  Yes Danton Clap, PA-C  lovastatin (MEVACOR) 10 MG tablet Take 10 mg by mouth every Sunday. 01/21/20  Yes [provider]  metFORMIN (GLUCOPHAGE) 500 MG tablet Take 500 mg by mouth daily.  06/17/18  Yes [provider]  molnupiravir EUA (LAGEVRIO) 200 mg CAPS capsule Take 4 capsules (800 mg total) by mouth 2 (two) times daily for 5 days. 02/18/22 02/23/22 Yes Brimage, Vondra, DO  POTASSIUM PO Take 1 tablet by mouth  daily.   Yes [provider]    Family History Family History  Problem Relation Age of Onset   Diabetes Mother    Hypertension Mother    Dementia Father    Breast cancer Neg Hx     Social History Social History   Tobacco Use   Smoking status: Never   Smokeless tobacco: Never  Vaping Use   Vaping Use: Never used  Substance Use Topics   Alcohol use: Yes    Comment: rare   Drug use: Never     Allergies   Tramadol   Review of Systems Review of Systems  Constitutional:   Positive for fatigue and fever. Negative for chills and diaphoresis.  HENT:  Positive for congestion. Negative for ear pain, rhinorrhea, sinus pressure, sinus pain and sore throat.   Respiratory:  Positive for cough and chest tightness. Negative for shortness of breath.   Cardiovascular:  Positive for chest pain.  Gastrointestinal:  Negative for abdominal pain, nausea and vomiting.  Musculoskeletal:  Negative for arthralgias and myalgias.  Skin:  Negative for rash.  Neurological:  Negative for weakness and headaches.  Hematological:  Negative for adenopathy.     Physical Exam Triage Vital Signs ED Triage Vitals  Enc Vitals Group     BP 02/19/22 1649 (!) 116/98     Pulse Rate 02/19/22 1649 (!) 105     Resp 02/19/22 1649 18     Temp 02/19/22 1649 99.4 F (37.4 C)     Temp Source 02/19/22 1649 Oral     SpO2 02/19/22 1649 98 %     Weight 02/19/22 1648 162 lb (73.5 kg)     Height 02/19/22 1648 '5\' 3"'$  (1.6 m)     Head Circumference --      Peak Flow --      Pain Score 02/19/22 1647 3     Pain Loc --      Pain Edu? --      Excl. in [place]? --    No data found.  Updated Vital Signs BP (!) 116/98 (BP Location: Left Arm)   Pulse (!) 105   Temp 99.4 F (37.4 C) (Oral)   Resp 18   Ht '5\' 3"'$  (1.6 m)   Wt 162 lb (73.5 kg)   SpO2 98%   BMI 28.70 kg/m      Physical Exam Vitals and nursing note reviewed.  Constitutional:      General: She is not in acute distress.    Appearance: Normal appearance. She is not ill-appearing or toxic-appearing.  HENT:     Head: Normocephalic and atraumatic.     Nose: Congestion present.     Mouth/Throat:     Mouth: Mucous membranes are moist.     Pharynx: Oropharynx is clear.  Eyes:     General: No scleral icterus.       Right eye: No discharge.        Left eye: No discharge.     Conjunctiva/sclera: Conjunctivae normal.  Cardiovascular:     Rate and Rhythm: Tachycardia present. Rhythm irregular.     Heart sounds: Normal heart sounds.   Pulmonary:     Effort: Pulmonary effort is normal. No respiratory distress.     Breath sounds: Normal breath sounds.  Chest:     Chest wall: No tenderness.  Musculoskeletal:     Cervical back: Neck supple.  Skin:    General: Skin is dry.  Neurological:     General: No focal deficit  present.     Mental Status: She is alert. Mental status is at baseline.     Motor: No weakness.     Gait: Gait normal.  Psychiatric:        Mood and Affect: Mood normal.        Behavior: Behavior normal.        Thought Content: Thought content normal.      UC Treatments / Results  Labs (all labs ordered are listed, but only abnormal results are displayed) Labs Reviewed - No data to display  EKG   Radiology No results found.  Procedures ED EKG  Date/Time: 02/19/2022 5:31 PM  Performed by: Danton Clap, PA-C Authorized by: Danton Clap, PA-C   Previous ECG:    Previous ECG:  Unavailable Interpretation:    Interpretation: abnormal   Rate:    ECG rate:  101   ECG rate assessment: tachycardic   Rhythm:    Rhythm: sinus rhythm   Ectopy:    Ectopy: PAC and PVCs     PVCs:  Frequent QRS:    QRS axis:  Normal   QRS intervals:  Normal   QRS conduction: normal   ST segments:    ST segments:  Normal T waves:    T waves: normal   Comments:     Sinus tachycardia with PVCs and PACs  (including critical care time)  Medications Ordered in UC Medications - No data to display  Initial Impression / Assessment and Plan / UC Course  I have reviewed the triage vital signs and the nursing notes.  Pertinent labs & imaging results that were available during my care of the patient were reviewed by me and considered in my medical decision making (see chart for details).   61 year old female with diagnosis of COVID-19 yesterday presents for chest tightness for several hours today.  Associated with palpitations.  Associated with fever, cough and congestion.  She has taken Tylenol for the  fever and the fever has broken but she continues to feel tightness in her chest which she reports is 3 out of 10.  No history of heart conditions.  Patient is presently afebrile.  She is tachycardic at 105 bpm.  She is overall well-appearing and in no acute distress.  She has nasal congestion on exam.  Chest clear to auscultation.  Occasional irregular beat and tachycardia noted.  EKG performed today shows sinus tachycardia with PVCs and PACs.  No old EKGs to compare to.  Given patient's continued chest tightness that has not improved over several hours in the face of COVID-19, advised further work-up in the emergency department.  She does have abnormal EKG.  She says she feels well enough to go home first and then have her husband take her to the ER.  She declines EMS transport.  She plans to go to [facility] at this time and is leaving in stable condition.  Final Clinical Impressions(s) / UC Diagnoses   Final diagnoses:  Chest tightness  Palpitations  COVID-19     Discharge Instructions      -You should follow-up in the emergency department.  As discussed, COVID can cause damage to your heart and you are showing evidence of abnormal heart rhythm.  You have been advised to follow up immediately in the emergency department for concerning signs.symptoms. If you declined EMS transport, please have a family member take you directly to the ED at this time. Do not delay. Based on concerns about condition, if you do  not follow up in th e ED, you may risk poor outcomes including worsening of condition, delayed treatment and potentially life threatening issues. If you have declined to go to the ED at this time, you should call your PCP immediately to set up a follow up appointment.  Go to ED for red flag symptoms, including; fevers you cannot reduce with Tylenol/Motrin, severe headaches, vision changes, numbness/weakness in part of the body, lethargy, confusion, intractable vomiting, severe dehydration,  chest pain, breathing difficulty, severe persistent abdominal or pelvic pain, signs of severe infection (increased redness, swelling of an area), feeling faint or passing out, dizziness, etc. You should especially go to the ED for sudden acute worsening of condition if you do not elect to go at this time.    ED Prescriptions   None    PDMP not reviewed this encounter.   Danton Clap, PA-C 02/19/22 1736

## 2022-02-19 NOTE — ED Notes (Signed)
Patient is being discharged from the Urgent Care and sent to the Emergency Department via Personal Vehicle . Per Jerene Pitch, [place], patient is in need of higher level of care due to Chest Pain. Patient is aware and verbalizes understanding of plan of care.  Vitals:   02/19/22 1649  BP: (!) 116/98  Pulse: (!) 105  Resp: 18  Temp: 99.4 F (37.4 C)  SpO2: 98%

## 2022-03-21 IMAGING — MG MM DIGITAL SCREENING BILAT W/ TOMO AND CAD
8 series · 8 of 24 positions shown · non-contrast
Comparison: Previous exams.

CLINICAL DATA: Screening.

EXAM:
DIGITAL SCREENING BILATERAL MAMMOGRAM WITH TOMOSYNTHESIS AND CAD
TECHNIQUE: Bilateral screening digital craniocaudal and mediolateral oblique
mammograms were obtained. Bilateral screening digital breast
tomosynthesis was performed. The images were evaluated with
computer-aided detection.

[R MLO synth-2D]
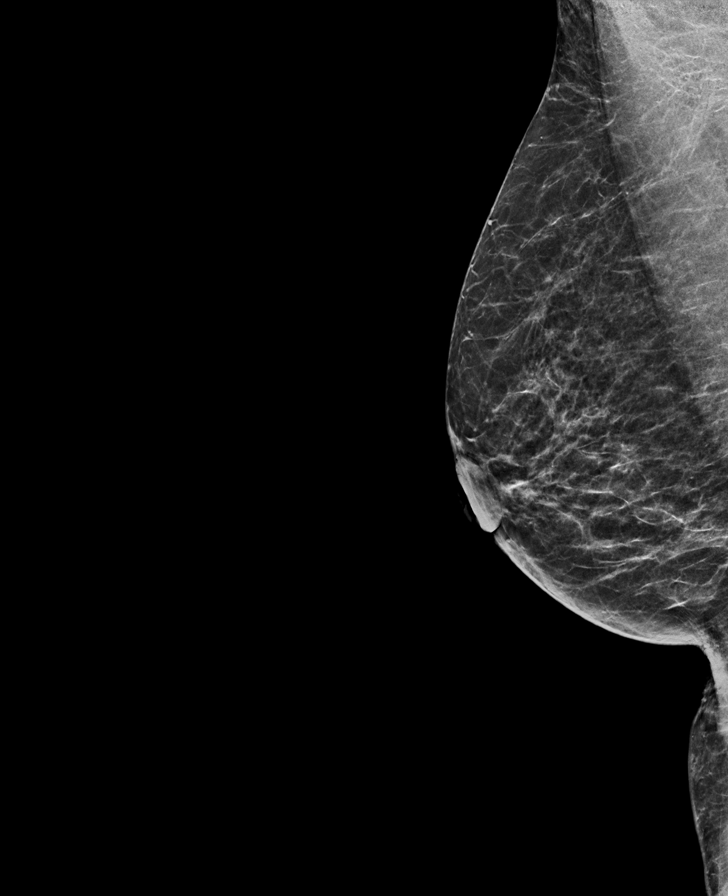

[L CC synth-2D]
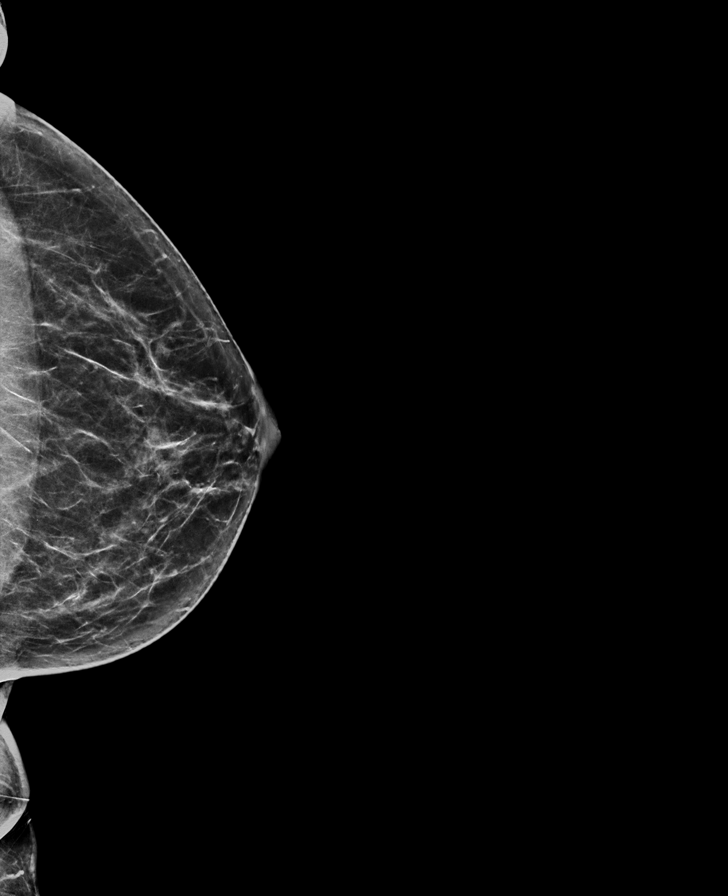

[L MLO synth-2D]
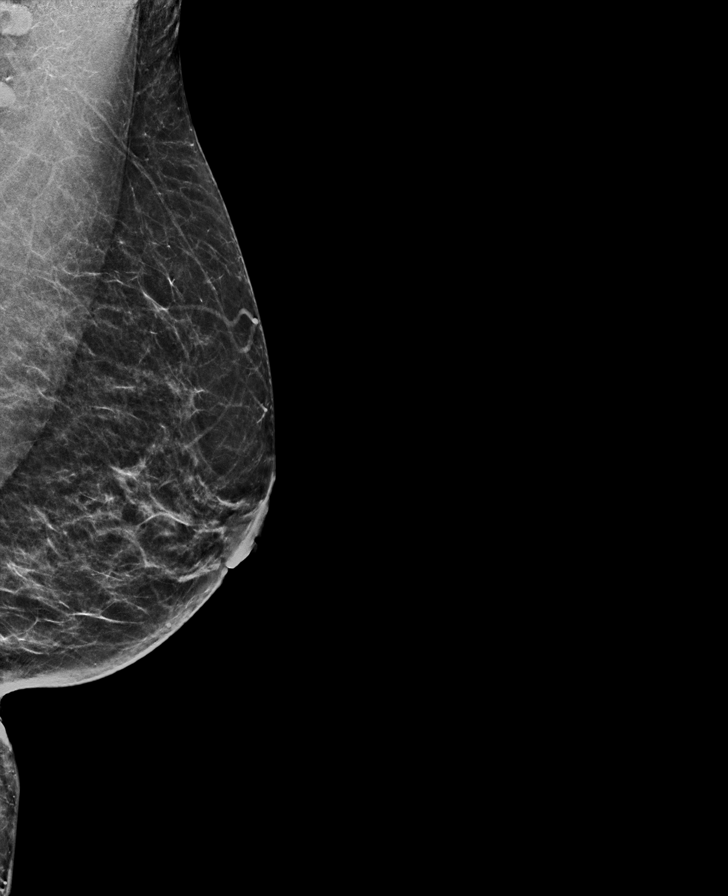

[R CC synth-2D]
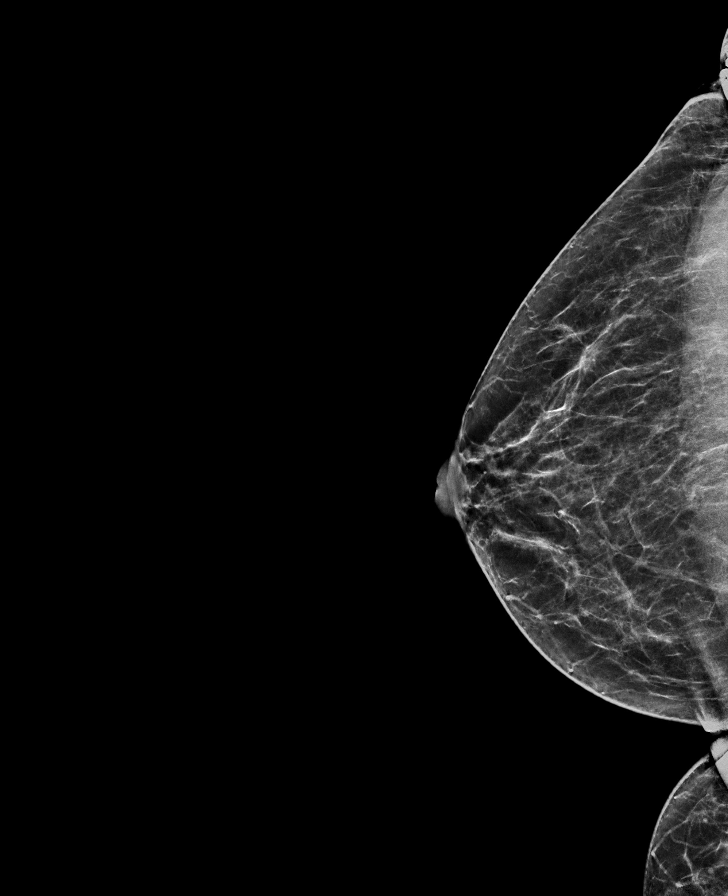

[R MLO tomo · tomo slice 27/54.0]
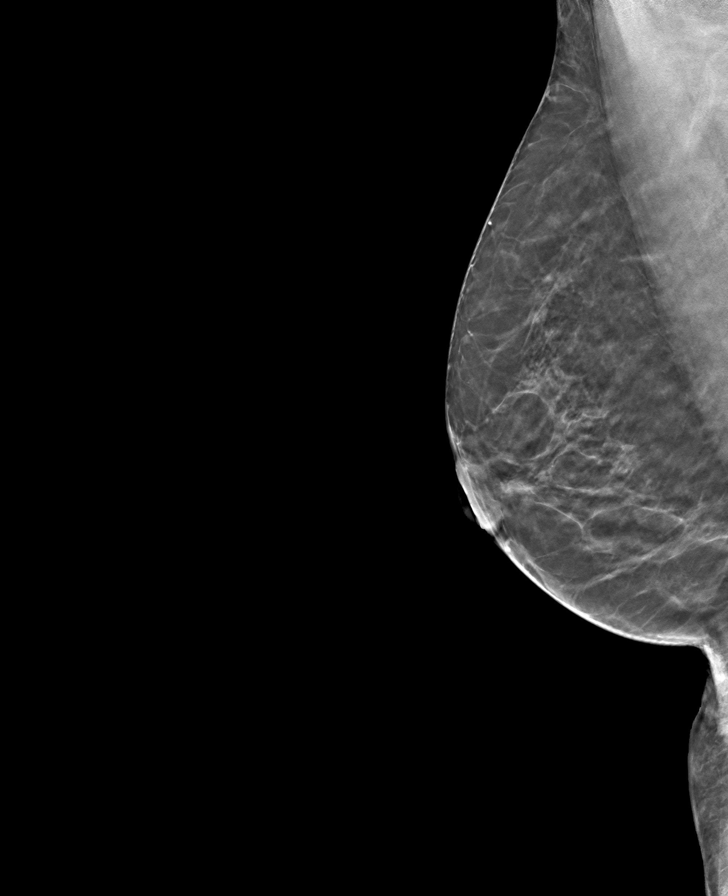

[L MLO tomo · tomo slice 29/57.0]
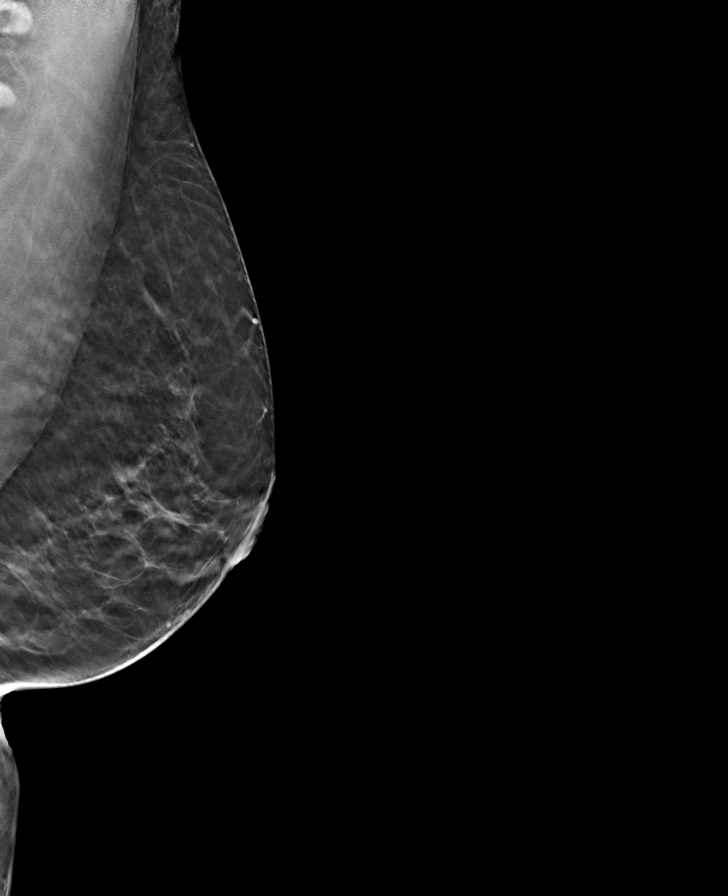

[L CC tomo · tomo slice 32/63.0]
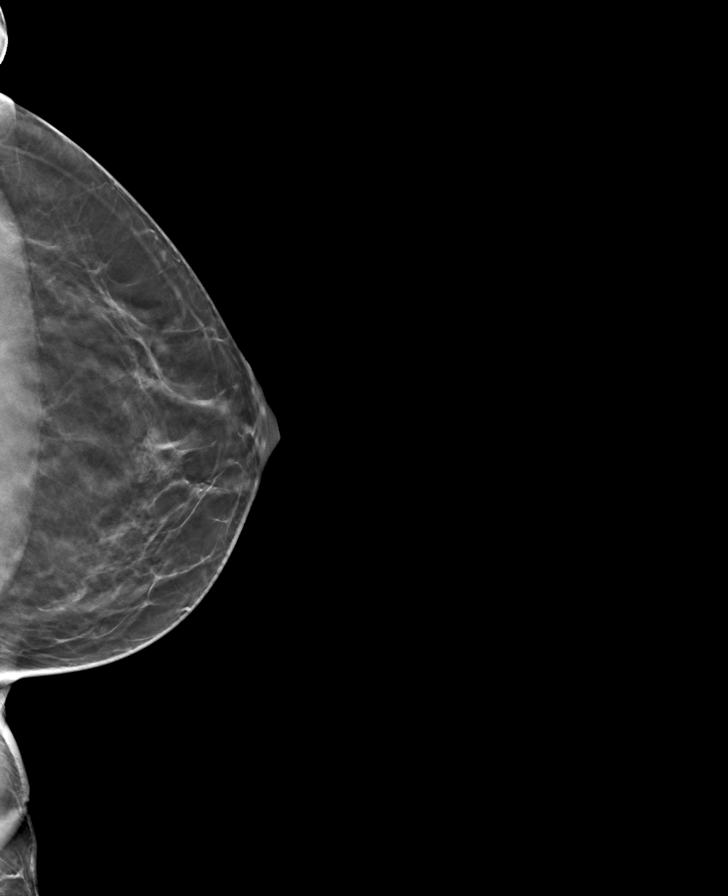

[R CC tomo · tomo slice 31/60.0]
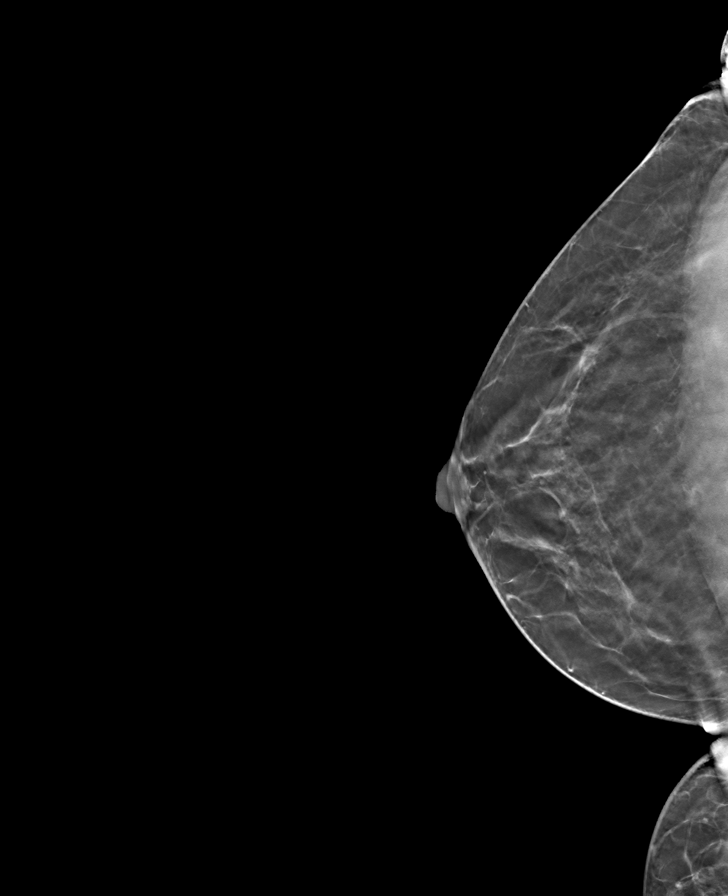

[8 of 24 positions shown; findings below may reference images not displayed]

ACR Breast Density Category b: There are scattered areas of
fibroglandular density.
FINDINGS: In the right breast, a possible asymmetry warrants further
evaluation. In the left breast, no findings suspicious for
malignancy.
IMPRESSION: Further evaluation is suggested for possible asymmetry in the right
breast.

RECOMMENDATION:
Diagnostic mammogram and possibly ultrasound of the right breast.
(Code:7P-3-GG4)

The patient will be contacted regarding the findings, and additional
imaging will be scheduled.

BI-RADS CATEGORY  0: Incomplete. Need additional imaging evaluation
and/or prior mammograms for comparison.

## 2022-07-10 ENCOUNTER — Other Ambulatory Visit: Payer: Self-pay

## 2022-07-10 DIAGNOSIS — Z1231 Encounter for screening mammogram for malignant neoplasm of breast: Secondary | ICD-10-CM

## 2022-10-29 ENCOUNTER — Ambulatory Visit
Admission: RE | Admit: 2022-10-29 | Discharge: 2022-10-29 | Disposition: A | Discharge status: Home / Self Care | Payer: BC Managed Care – PPO | Source: Ambulatory Visit | Attending: Gerontology | Admitting: Gerontology

## 2022-10-29 DIAGNOSIS — Z1231 Encounter for screening mammogram for malignant neoplasm of breast: Secondary | ICD-10-CM | POA: Insufficient documentation

## 2023-01-23 ENCOUNTER — Ambulatory Visit
Admission: EM | Admit: 2023-01-23 | Discharge: 2023-01-23 | Disposition: A | Discharge status: Home / Self Care | Payer: BC Managed Care – PPO | Attending: Family Medicine | Admitting: Family Medicine

## 2023-01-23 ENCOUNTER — Encounter: Payer: Self-pay | Admitting: Emergency Medicine

## 2023-01-23 DIAGNOSIS — N3001 Acute cystitis with hematuria: Secondary | ICD-10-CM | POA: Insufficient documentation

## 2023-01-23 LAB — URINALYSIS, W/ REFLEX TO CULTURE (INFECTION SUSPECTED)
Bilirubin Urine: NEGATIVE
Glucose, UA: NEGATIVE mg/dL
Ketones, ur: NEGATIVE mg/dL
Nitrite: NEGATIVE
Protein, ur: 30 mg/dL — AB
RBC / HPF: >50 RBC/hpf (ref 0–5)
Specific Gravity, Urine: 1.015 (ref 1.005–1.030)
WBC, UA: >50 WBC/hpf (ref 0–5)
pH: 6 (ref 5.0–8.0)

## 2023-01-23 MED ORDER — NITROFURANTOIN MONOHYD MACRO 100 MG PO CAPS: 100.0000 mg | ORAL_CAPSULE | Freq: Two times a day (BID) | ORAL | 0 refills | Status: DC

## 2023-01-23 NOTE — Discharge Instructions (Signed)
Stop by the pharmacy to pick up your prescriptions.  Follow up with your primary care provider as needed.  

## 2023-01-23 NOTE — ED Triage Notes (Signed)
Pt c/o dysuria and urinary frequency started today.

## 2023-01-23 NOTE — ED Provider Notes (Signed)
[facility]    CSN: [id number] Arrival date & time: 01/23/23  1536      History   Chief Complaint Chief Complaint  Patient presents with   Dysuria   Urinary Frequency     HPI HPI Brandy Hayden is a 62 y.o. female.    Brandy Hayden presents for dysuria and urinary frequency that started today.  She had back pain about a week ago.  Tried nothing prior to arrival.  Has  not had any antibiotics in last 30 days.   Denies known STI exposure.      History reviewed. No pertinent past medical history.  Patient Active Problem List   Diagnosis Date Noted   Hypertension associated with type 2 diabetes mellitus ([facility]) 08/01/2021   PMB (postmenopausal bleeding) 08/01/2021   Diverticulosis 06/07/2021   Seasonal allergies 06/01/2021   Bunion 03/17/2021   Nodule of finger, bilateral 03/17/2021   Splinter hemorrhage of fingernail 09/27/2020   Chronic hip pain, left 09/24/2019   Varicose veins of both legs with edema 09/24/2019   Numbness and tingling in both hands 04/11/2018   High risk medication use 03/19/2018   Hypercholesterolemia 03/19/2018   B12 deficiency 05/27/2017   Type 2 diabetes mellitus with other specified complication ([facility]) 12/11/2016   Fibroid uterus 11/22/2015   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      Home Medications    Prior to Admission medications   Medication Sig Start Date End Date Taking? Authorizing Provider  nitrofurantoin, macrocrystal-monohydrate, (MACROBID) 100 MG capsule Take 1 capsule (100 mg total) by mouth 2 (two) times daily. 01/23/23  Yes Dontrez Pettis, Seward Meth, DO  aspirin EC 81 MG tablet Take 81 mg by mouth daily. Swallow whole.    [provider]  benzonatate (TESSALON) 100 MG capsule Take 2 capsules (200 mg  total) by mouth every 8 (eight) hours. 07/30/21   Shirlee Latch, PA-C  Cholecalciferol (VITAMIN D3 PO) Take 1 capsule by mouth daily.    [provider]  Cyanocobalamin (B-12 PO) Take 1 capsule by mouth daily.    [provider]  ipratropium (ATROVENT) 0.06 % nasal spray Place 2 sprays into both nostrils 4 (four) times daily. 07/30/21   Shirlee Latch, PA-C  lisinopril-hydrochlorothiazide (ZESTORETIC) 10-12.5 MG tablet Take 1 tablet by mouth daily.  10/13/18   [provider]  loratadine-pseudoephedrine (CLARITIN-D 12 HOUR) 5-120 MG tablet Take 1 tablet by mouth 2 (two) times daily. 07/30/21   Shirlee Latch, PA-C  lovastatin (MEVACOR) 10 MG tablet Take 10 mg by mouth every Sunday. 01/21/20   [provider]  metFORMIN (GLUCOPHAGE) 500 MG tablet Take 500 mg by mouth daily.  06/17/18   [provider]  POTASSIUM PO Take 1 tablet by mouth daily.    [provider]    Family History Family History  Problem Relation Age of Onset   Diabetes Mother    Hypertension Mother    Dementia Father    Breast cancer Neg Hx     Social History Social History   Tobacco Use   Smoking status: Never   Smokeless tobacco: Never  Vaping Use   Vaping status: Never Used  Substance Use Topics   Alcohol use: Yes    Comment: rare  Drug use: Never     Allergies   Tramadol   Review of Systems Review of Systems: :negative unless otherwise stated in HPI.      Physical Exam Triage Vital Signs ED Triage Vitals  Encounter Vitals Group     BP 01/23/23 1555 116/87     Systolic BP Percentile --      Diastolic BP Percentile --      Pulse Rate 01/23/23 1555 89     Resp 01/23/23 1555 18     Temp 01/23/23 1555 98.5 F (36.9 C)     Temp Source 01/23/23 1555 Oral     SpO2 01/23/23 1555 100 %     Weight --      Height --      Head Circumference --      Peak Flow --      Pain Score 01/23/23 1554 0     Pain Loc --      Pain Education --       Exclude from Growth Chart --    No data found.  Updated Vital Signs BP 116/87 (BP Location: Left Arm)   Pulse 89   Temp 98.5 F (36.9 C) (Oral)   Resp 18   SpO2 100%   Visual Acuity Right Eye Distance:   Left Eye Distance:   Bilateral Distance:    Right Eye Near:   Left Eye Near:    Bilateral Near:     Physical Exam GEN: well appearing female in no acute distress  CVS: well perfused  RESP: speaking in full sentences without pause    UC Treatments / Results  Labs (all labs ordered are listed, but only abnormal results are displayed) Labs Reviewed  URINALYSIS, W/ REFLEX TO CULTURE (INFECTION SUSPECTED) - Abnormal; Notable for the following components:      Result Value   APPearance CLOUDY (*)    Hgb urine dipstick LARGE (*)    Protein, ur 30 (*)    Leukocytes,Ua MODERATE (*)    Bacteria, UA MANY (*)    All other components within normal limits  URINE CULTURE    EKG   Radiology No results found.  Procedures Procedures (including critical care time)  Medications Ordered in UC Medications - No data to display  Initial Impression / Assessment and Plan / UC Course  I have reviewed the triage vital signs and the nursing notes.  Pertinent labs & imaging results that were available during my care of the patient were reviewed by me and considered in my medical decision making (see chart for details).     Acute cystitis:  Patient is a 62 y.o. female  who presents for 1 day of dysuria and  urinary frequency.  Overall patient is well-appearing and afebrile.  Vital signs stable.  UA consistent with acute cystitis.  Hematuria supported on microscopy.  Treat with Macrobid 2 times daily for 5 days.  Urine culture obtained.  Follow-up sensitivities and change antibiotics, if needed.   - Return precautions including abdominal pain, fever, chills, nausea, or vomiting given. Follow-up,  if symptoms not improving or getting worse. Discussed MDM, treatment plan and plan for  follow-up with patientwho agrees with plan.        Final Clinical Impressions(s) / UC Diagnoses   Final diagnoses:  Acute cystitis with hematuria     Discharge Instructions      Stop by the pharmacy to pick up your prescriptions.  Follow up with your primary care provider as needed.  ED Prescriptions     Medication Sig Dispense Auth. Provider   nitrofurantoin, macrocrystal-monohydrate, (MACROBID) 100 MG capsule Take 1 capsule (100 mg total) by mouth 2 (two) times daily. 10 capsule Katha Cabal, DO      PDMP not reviewed this encounter.   Katha Cabal, DO 01/23/23 1808

## 2023-01-25 LAB — URINE CULTURE: Culture: >=100000 — AB

## 2023-04-06 ENCOUNTER — Ambulatory Visit
Admission: EM | Admit: 2023-04-06 | Discharge: 2023-04-06 | Disposition: A | Discharge status: Home / Self Care | Payer: BC Managed Care – PPO | Attending: Family Medicine | Admitting: Family Medicine

## 2023-04-06 DIAGNOSIS — N3001 Acute cystitis with hematuria: Secondary | ICD-10-CM | POA: Diagnosis not present

## 2023-04-06 LAB — URINALYSIS, W/ REFLEX TO CULTURE (INFECTION SUSPECTED)
Bilirubin Urine: NEGATIVE
Glucose, UA: NEGATIVE mg/dL
Ketones, ur: NEGATIVE mg/dL
Nitrite: NEGATIVE
Protein, ur: 100 mg/dL — AB
RBC / HPF: >50 RBC/hpf (ref 0–5)
Specific Gravity, Urine: 1.02 (ref 1.005–1.030)
WBC, UA: >50 WBC/hpf (ref 0–5)
pH: 7 (ref 5.0–8.0)

## 2023-04-06 MED ORDER — CIPROFLOXACIN HCL 500 MG PO TABS: 500.0000 mg | ORAL_TABLET | Freq: Two times a day (BID) | ORAL | 0 refills | Status: DC

## 2023-04-06 NOTE — ED Provider Notes (Signed)
 [facility]    CSN: [id number] Arrival date & time: 04/06/23  1000   History   Chief Complaint Chief Complaint  Patient presents with   Urinary Tract Infection    HPI  63 year old female presents with suspected UTI.  Patient had a UTI in October.  She states that her symptoms started last night and early this morning.  She reports dysuria and hematuria.  No fever.  No abdominal pain.  No flank pain.  No relieving factors.  No other complaints.  Home Medications    Prior to Admission medications   Medication Sig Start Date End Date Taking? Authorizing Provider  ciprofloxacin  (CIPRO ) 500 MG tablet Take 1 tablet (500 mg total) by mouth 2 (two) times daily. 04/06/23  Yes Recia Sons G, DO  aspirin EC 81 MG tablet Take 81 mg by mouth daily. Swallow whole.   Yes [provider]  benzonatate  (TESSALON ) 100 MG capsule Take 2 capsules (200 mg total) by mouth every 8 (eight) hours. 07/30/21   Arvis Jolan NOVAK, PA-C  Cholecalciferol (VITAMIN D3 PO) Take 1 capsule by mouth daily.   Yes [provider]  Cyanocobalamin (B-12 PO) Take 1 capsule by mouth daily.   Yes [provider]  ipratropium (ATROVENT ) 0.06 % nasal spray Place 2 sprays into both nostrils 4 (four) times daily. 07/30/21   Arvis Jolan NOVAK, PA-C  lisinopril-hydrochlorothiazide (ZESTORETIC) 10-12.5 MG tablet Take 1 tablet by mouth daily.  10/13/18  Yes [provider]  loratadine -pseudoephedrine (CLARITIN -D 12 HOUR) 5-120 MG tablet Take 1 tablet by mouth 2 (two) times daily. 07/30/21   Arvis Jolan NOVAK, PA-C  lovastatin (MEVACOR) 10 MG tablet Take 10 mg by mouth every Sunday. 01/21/20  Yes [provider]  metFORMIN (GLUCOPHAGE) 500 MG tablet Take 500 mg by mouth daily.  06/17/18  Yes [provider]  POTASSIUM PO Take 1 tablet by mouth daily.   Yes [provider]    Family History Family History  Problem Relation Age of Onset   Diabetes Mother    Hypertension  Mother    Dementia Father    Breast cancer Neg Hx     Social History Social History   Tobacco Use   Smoking status: Never   Smokeless tobacco: Never  Vaping Use   Vaping status: Never Used  Substance Use Topics   Alcohol use: Yes    Comment: rare   Drug use: Never     Allergies   Tramadol   Review of Systems Review of Systems Per HPI  Physical Exam Triage Vital Signs ED Triage Vitals  Encounter Vitals Group     BP 04/06/23 1136 124/82     Systolic BP Percentile --      Diastolic BP Percentile --      Pulse Rate 04/06/23 1136 78     Resp --      Temp 04/06/23 1136 98.2 F (36.8 C)     Temp Source 04/06/23 1136 Oral     SpO2 04/06/23 1136 98 %     Weight 04/06/23 1135 155 lb (70.3 kg)     Height 04/06/23 1135 5' 3 (1.6 m)     Head Circumference --      Peak Flow --      Pain Score 04/06/23 1134 1     Pain Loc --      Pain Education --      Exclude from Growth Chart --    No data found.  Updated  Vital Signs BP 124/82 (BP Location: Left Arm)   Pulse 78   Temp 98.2 F (36.8 C) (Oral)   Ht 5' 3 (1.6 m)   Wt 70.3 kg   SpO2 98%   BMI 27.46 kg/m   Visual Acuity Right Eye Distance:   Left Eye Distance:   Bilateral Distance:    Right Eye Near:   Left Eye Near:    Bilateral Near:     Physical Exam Vitals and nursing note reviewed.  Constitutional:      General: She is not in acute distress.    Appearance: Normal appearance.  HENT:     Head: Normocephalic and atraumatic.  Cardiovascular:     Rate and Rhythm: Normal rate and regular rhythm.  Pulmonary:     Effort: Pulmonary effort is normal.     Breath sounds: Normal breath sounds. No wheezing or rales.  Abdominal:     General: There is no distension.     Palpations: Abdomen is soft.     Tenderness: There is no abdominal tenderness.  Neurological:     Mental Status: She is alert.      UC Treatments / Results  Labs (all labs ordered are listed, but only abnormal results are  displayed) Labs Reviewed  URINALYSIS, W/ REFLEX TO CULTURE (INFECTION SUSPECTED) - Abnormal; Notable for the following components:      Result Value   APPearance HAZY (*)    Hgb urine dipstick LARGE (*)    Protein, ur 100 (*)    Leukocytes,Ua LARGE (*)    Bacteria, UA FEW (*)    All other components within normal limits  URINE CULTURE    EKG   Radiology No results found.  Procedures Procedures (including critical care time)  Medications Ordered in UC Medications - No data to display  Initial Impression / Assessment and Plan / UC Course  I have reviewed the triage vital signs and the nursing notes.  Pertinent labs & imaging results that were available during my care of the patient were reviewed by me and considered in my medical decision making (see chart for details).    63 year old female presents with UTI.  Placing on Cipro  based on prior culture.  Patient was previously on nitrofurantoin  so I do not want to use this.  Bactrim interacts with one of her medication.  Prior culture with Citrobacter which was sensitive to Cipro .  Rx sent.  Final Clinical Impressions(s) / UC Diagnoses   Final diagnoses:  Acute cystitis with hematuria   Discharge Instructions   None    ED Prescriptions     Medication Sig Dispense Auth. Provider   ciprofloxacin  (CIPRO ) 500 MG tablet Take 1 tablet (500 mg total) by mouth 2 (two) times daily. 14 tablet Nocole Zammit G, DO      PDMP not reviewed this encounter.   Cinzia Devos G, [place] 04/06/23 1228

## 2023-04-06 NOTE — ED Triage Notes (Signed)
 Pt c/o urinary pain and blood in urine x1day

## 2023-04-09 LAB — URINE CULTURE: Culture: >=100000 — AB

## 2023-10-02 ENCOUNTER — Other Ambulatory Visit: Payer: Self-pay | Admitting: Gerontology

## 2023-10-02 DIAGNOSIS — Z1231 Encounter for screening mammogram for malignant neoplasm of breast: Secondary | ICD-10-CM

## 2023-11-19 ENCOUNTER — Ambulatory Visit
Admission: RE | Admit: 2023-11-19 | Discharge: 2023-11-19 | Disposition: A | Discharge status: Home / Self Care | Source: Ambulatory Visit | Attending: Gerontology | Admitting: Gerontology

## 2023-11-19 DIAGNOSIS — Z1231 Encounter for screening mammogram for malignant neoplasm of breast: Secondary | ICD-10-CM | POA: Insufficient documentation

## 2024-01-19 ENCOUNTER — Ambulatory Visit
Admission: RE | Admit: 2024-01-19 | Discharge: 2024-01-19 | Disposition: A | Discharge status: Home / Self Care | Source: Ambulatory Visit | Attending: Student | Admitting: Student

## 2024-01-19 VITALS — BP 134/85 | HR 78 | Temp 98.9°F | Resp 14 | Ht 63.0 in | Wt 155.0 lb

## 2024-01-19 DIAGNOSIS — N3001 Acute cystitis with hematuria: Secondary | ICD-10-CM | POA: Diagnosis not present

## 2024-01-19 LAB — URINALYSIS, W/ REFLEX TO CULTURE (INFECTION SUSPECTED)
Bilirubin Urine: NEGATIVE
Glucose, UA: NEGATIVE mg/dL
Ketones, ur: NEGATIVE mg/dL
Nitrite: NEGATIVE
Protein, ur: NEGATIVE mg/dL
RBC / HPF: >50 RBC/hpf (ref 0–5)
Specific Gravity, Urine: 1.02 (ref 1.005–1.030)
pH: 6.5 (ref 5.0–8.0)

## 2024-01-19 MED ORDER — CIPROFLOXACIN HCL 500 MG PO TABS: 500.0000 mg | ORAL_TABLET | Freq: Two times a day (BID) | ORAL | 0 refills | Status: AC

## 2024-01-19 NOTE — ED Provider Notes (Signed)
 [facility]    CSN: [id number] Arrival date & time: 01/19/24  1354      History   Chief Complaint Chief Complaint  Patient presents with   Urinary Frequency    Appointment    HPI Brandy Hayden is a 63 y.o. female presenting with urinary symptoms for 12 hours.  Medical history UTI, hypertension, diabetes.  She describes urinary frequency and dysuria, which is consistent with her typical UTI symptoms.  Her last UTI was 04/2023, and was treated with ciprofloxacin .  Today, she denies abdominal pain, new back pain, fevers, vaginal symptoms like discharge.  HPI  History reviewed. No pertinent past medical history.  Patient Active Problem List   Diagnosis Date Noted   Hypertension associated with type 2 diabetes mellitus (HCC) 08/01/2021   PMB (postmenopausal bleeding) 08/01/2021   Diverticulosis 06/07/2021   Seasonal allergies 06/01/2021   Bunion 03/17/2021   Nodule of finger, bilateral 03/17/2021   Splinter hemorrhage of fingernail 09/27/2020   Chronic hip pain, left 09/24/2019   Varicose veins of both legs with edema 09/24/2019   Numbness and tingling in both hands 04/11/2018   High risk medication use 03/19/2018   Hypercholesterolemia 03/19/2018   B12 deficiency 05/27/2017   Type 2 diabetes mellitus with other specified complication (HCC) 12/11/2016   Fibroid uterus 11/22/2015   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      Home Medications    Prior to Admission medications   Medication Sig Start Date End Date Taking? Authorizing Provider  aspirin EC 81 MG tablet Take 81 mg by mouth daily. Swallow whole.    [provider]  benzonatate  (TESSALON ) 100 MG capsule Take 2 capsules (200 mg total) by mouth every 8 (eight) hours. 07/30/21   Arvis Jolan NOVAK,  PA-C  Cholecalciferol (VITAMIN D3 PO) Take 1 capsule by mouth daily.    [provider]  ciprofloxacin  (CIPRO ) 500 MG tablet Take 1 tablet (500 mg total) by mouth 2 (two) times daily. 01/19/24   Denzil Bristol E, PA-C  Cyanocobalamin (B-12 PO) Take 1 capsule by mouth daily.    [provider]  ipratropium (ATROVENT ) 0.06 % nasal spray Place 2 sprays into both nostrils 4 (four) times daily. 07/30/21   Arvis Jolan NOVAK, PA-C  lisinopril-hydrochlorothiazide (ZESTORETIC) 10-12.5 MG tablet Take 1 tablet by mouth daily.  10/13/18   [provider]  loratadine -pseudoephedrine (CLARITIN -D 12 HOUR) 5-120 MG tablet Take 1 tablet by mouth 2 (two) times daily. 07/30/21   Arvis Jolan NOVAK, PA-C  lovastatin (MEVACOR) 10 MG tablet Take 10 mg by mouth every Sunday. 01/21/20   [provider]  metFORMIN (GLUCOPHAGE) 500 MG tablet Take 500 mg by mouth daily.  06/17/18   [provider]  POTASSIUM PO Take 1 tablet by mouth daily.    [provider]    Family History Family History  Problem Relation Age of Onset   Diabetes Mother    Hypertension Mother    Dementia Father    Breast cancer Neg Hx     Social History Social History   Tobacco Use   Smoking status: Never   Smokeless tobacco: Never  Vaping Use   Vaping status: Never Used  Substance Use Topics   Alcohol use: Yes  Comment: rare   Drug use: Never     Allergies   Tramadol   Review of Systems Review of Systems  Constitutional:  Negative for appetite change, chills, diaphoresis and fever.  Respiratory:  Negative for shortness of breath.   Cardiovascular:  Negative for chest pain.  Gastrointestinal:  Negative for abdominal pain, blood in stool, constipation, diarrhea, nausea and vomiting.  Genitourinary:  Negative for decreased urine volume, difficulty urinating, dysuria, flank pain, frequency, genital sores, hematuria and urgency.  Musculoskeletal:  Negative for back pain.   Neurological:  Negative for dizziness, weakness and light-headedness.  All other systems reviewed and are negative.    Physical Exam Triage Vital Signs ED Triage Vitals  Encounter Vitals Group     BP 01/19/24 1423 134/85     Girls Systolic BP Percentile --      Girls Diastolic BP Percentile --      Boys Systolic BP Percentile --      Boys Diastolic BP Percentile --      Pulse Rate 01/19/24 1423 78     Resp 01/19/24 1423 14     Temp 01/19/24 1423 98.9 F (37.2 C)     Temp Source 01/19/24 1423 Oral     SpO2 01/19/24 1423 98 %     Weight 01/19/24 1421 154 lb 15.7 oz (70.3 kg)     Height 01/19/24 1421 5' 3 (1.6 m)     Head Circumference --      Peak Flow --      Pain Score 01/19/24 1421 5     Pain Loc --      Pain Education --      Exclude from Growth Chart --    No data found.  Updated Vital Signs BP 134/85 (BP Location: Right Arm)   Pulse 78   Temp 98.9 F (37.2 C) (Oral)   Resp 14   Ht 5' 3 (1.6 m)   Wt 154 lb 15.7 oz (70.3 kg)   SpO2 98%   BMI 27.45 kg/m   Visual Acuity Right Eye Distance:   Left Eye Distance:   Bilateral Distance:    Right Eye Near:   Left Eye Near:    Bilateral Near:     Physical Exam Vitals reviewed.  Constitutional:      General: She is not in acute distress.    Appearance: Normal appearance. She is not ill-appearing.  HENT:     Head: Normocephalic and atraumatic.     Mouth/Throat:     Mouth: Mucous membranes are moist.     Comments: Moist mucous membranes Eyes:     Extraocular Movements: Extraocular movements intact.     Pupils: Pupils are equal, round, and reactive to light.  Cardiovascular:     Rate and Rhythm: Normal rate and regular rhythm.     Heart sounds: Normal heart sounds.  Pulmonary:     Effort: Pulmonary effort is normal.     Breath sounds: Normal breath sounds. No wheezing, rhonchi or rales.  Abdominal:     General: Bowel sounds are normal. There is no distension.     Palpations: Abdomen is soft. There is  no mass.     Tenderness: There is no abdominal tenderness. There is no right CVA tenderness, left CVA tenderness, guarding or rebound.  Skin:    General: Skin is warm.     Capillary Refill: Capillary refill takes less than 2 seconds.     Comments: Good skin turgor  Neurological:  General: No focal deficit present.     Mental Status: She is alert and oriented to person, place, and time.  Psychiatric:        Mood and Affect: Mood normal.        Behavior: Behavior normal.      UC Treatments / Results  Labs (all labs ordered are listed, but only abnormal results are displayed) Labs Reviewed  URINALYSIS, W/ REFLEX TO CULTURE (INFECTION SUSPECTED) - Abnormal; Notable for the following components:      Result Value   APPearance HAZY (*)    Hgb urine dipstick MODERATE (*)    Leukocytes,Ua MODERATE (*)    Bacteria, UA FEW (*)    All other components within normal limits  URINE CULTURE    EKG   Radiology No results found.  Procedures Procedures (including critical care time)  Medications Ordered in UC Medications - No data to display  Initial Impression / Assessment and Plan / UC Course  I have reviewed the triage vital signs and the nursing notes.  Pertinent labs & imaging results that were available during my care of the patient were reviewed by me and considered in my medical decision making (see chart for details).      Patient is a 63 year old female presenting with acute cystitis with hematuria.  UA with moderate blood, moderate leuk, few bacteria. She is afebrile and nontachycardic, with no CVAT or abdominal pain, so I do not have concern for pyelonephritis or nephrolithiasis.  We will manage her UTI with ciprofloxacin  based on culture from last UTI, which indicated resistance to ampicillin and macrobid .  She has tolerated ciprofloxacin  well in the past.  Discussed serious but rare ADR of tendon rupture.  Final Clinical Impressions(s) / UC Diagnoses   Final  diagnoses:  Acute cystitis with hematuria     Discharge Instructions      -Ciprofloxacin  twice daily x7 days. Drink plenty of water -We will call with urine culture results in 3 days, if they show that we need to change your antibiotic -Seek additional care if new symptoms like new back pain, new fevers, worsening urinary symptoms.   ED Prescriptions     Medication Sig Dispense Auth. Provider   ciprofloxacin  (CIPRO ) 500 MG tablet Take 1 tablet (500 mg total) by mouth 2 (two) times daily. 14 tablet Taraji Mungo E, PA-C      PDMP not reviewed this encounter.   Arlyss Leita BRAVO, PA-C 01/19/24 1526

## 2024-01-19 NOTE — ED Triage Notes (Signed)
 Patient c/o urinary frequency and dysuria that started this morning.  Patient denies fevers.

## 2024-01-19 NOTE — Discharge Instructions (Signed)
-  Ciprofloxacin  twice daily x7 days. Drink plenty of water -We will call with urine culture results in 3 days, if they show that we need to change your antibiotic -Seek additional care if new symptoms like new back pain, new fevers, worsening urinary symptoms.

## 2024-01-21 ENCOUNTER — Ambulatory Visit (HOSPITAL_COMMUNITY): Payer: Self-pay

## 2024-01-21 LAB — URINE CULTURE: Culture: 30000 — AB
